# Patient Record
Sex: Female | Born: 1990 | Race: Black or African American | Hispanic: No
Health system: Midwestern US, Community
[De-identification: ages and names within clinical notes are randomized; demographics above are authoritative.]

## PROBLEM LIST (undated history)

## (undated) ENCOUNTER — Emergency Department: Discharge status: Absent without leave | Payer: Medicaid Other

## (undated) DIAGNOSIS — N189 Chronic kidney disease, unspecified: Secondary | ICD-10-CM

## (undated) HISTORY — PX: TONSILLECTOMY: SUR1361

---

## 2014-08-20 HISTORY — PX: IR NEPHROSTOMY PLACEMENT RIGHT: IMG6064

## 2016-08-12 ENCOUNTER — Emergency Department (HOSPITAL_BASED_OUTPATIENT_CLINIC_OR_DEPARTMENT_OTHER)
Admission: EM | Admit: 2016-08-12 | Discharge: 2016-08-12 | Disposition: A | Discharge status: Home / Self Care | Payer: Self-pay | Attending: Emergency Medicine | Admitting: Emergency Medicine

## 2016-08-12 ENCOUNTER — Encounter (HOSPITAL_BASED_OUTPATIENT_CLINIC_OR_DEPARTMENT_OTHER): Payer: Self-pay | Admitting: *Deleted

## 2016-08-12 DIAGNOSIS — Z3A08 8 weeks gestation of pregnancy: Secondary | ICD-10-CM | POA: Insufficient documentation

## 2016-08-12 DIAGNOSIS — O219 Vomiting of pregnancy, unspecified: Secondary | ICD-10-CM | POA: Insufficient documentation

## 2016-08-12 LAB — BASIC METABOLIC PANEL
Anion gap: 10 (ref 5–15)
BUN: 10 mg/dL (ref 6–20)
CALCIUM: 9.3 mg/dL (ref 8.9–10.3)
CO2: 22 mmol/L (ref 22–32)
CREATININE: 0.51 mg/dL (ref 0.44–1.00)
Chloride: 103 mmol/L (ref 101–111)
GFR calc Af Amer: >60 mL/min (ref >60)
GFR calc non Af Amer: >60 mL/min (ref >60)
GLUCOSE: 95 mg/dL (ref 65–99)
Potassium: 3.3 mmol/L — ABNORMAL LOW (ref 3.5–5.1)
Sodium: 135 mmol/L (ref 135–145)

## 2016-08-12 MED ORDER — ONDANSETRON HCL 4 MG PO TABS: 4.0000 mg | ORAL_TABLET | Freq: Four times a day (QID) | ORAL | 0 refills | Status: DC

## 2016-08-12 MED ORDER — SODIUM CHLORIDE 0.9 % IV BOLUS (SEPSIS)
1000.0000 mL | Freq: Once | INTRAVENOUS | Status: AC
  Administered 2016-08-12: 1000 mL via INTRAVENOUS

## 2016-08-12 MED ORDER — ONDANSETRON HCL 4 MG/2ML IJ SOLN
4.0000 mg | Freq: Once | INTRAMUSCULAR | Status: AC
  Administered 2016-08-12: 4 mg via INTRAVENOUS
  Filled 2016-08-12: qty 2

## 2016-08-12 NOTE — Discharge Instructions (Signed)
Return here as needed.  Follow-up with your OB/GYN appointment slowly increase your fluid intake

## 2016-08-12 NOTE — ED Provider Notes (Signed)
[facility]   CSN: [id number] Arrival date & time: 08/12/16  1055     History   Chief Complaint Chief Complaint  Patient presents with  . Emesis    HPI Terry Richardson is a 25 y.o. female.  HPI Patient presents to the emergency department with nausea and vomiting in pregnancy.  The patient states that she found out she was pregnant and she went to the emergency department for nausea and vomiting several weeks ago.  She is given Zofran and discharged home.  She has an OB appointment this coming week.  The patient states that she continues to have nausea and vomiting.  She states nothing seems make the condition worse.   She states that she ran out of the Zofran, which did help with the symptomsThe patient denies chest pain, shortness of breath, headache,blurred vision, neck pain, fever, cough, weakness, numbness, dizziness, anorexia, edema, abdominal pain,diarrhea, rash, back pain, dysuria, hematemesis, bloody stool, near syncope, or syncope.  Patient denies any vaginal bleeding or discharge History reviewed. No pertinent past medical history.  There are no active problems to display for this patient.   History reviewed. No pertinent surgical history.  OB History    Gravida Para Term Preterm AB Living   1             SAB TAB Ectopic Multiple Live Births                   Home Medications    Prior to Admission medications   Medication Sig Start Date End Date Taking? Authorizing Provider  Prenatal Vit-Fe Fumarate-FA (MULTIVITAMIN-PRENATAL) 27-0.8 MG TABS tablet Take 1 tablet by mouth daily at 12 noon.   Yes Historical Provider, MD    Family History No family history on file.  Social History Social History  Substance Use Topics  . Smoking status: Never Smoker  . Smokeless tobacco: Never Used  . Alcohol use No     Allergies   Patient has no known allergies.   Review of Systems Review of Systems  All other systems negative except as  documented in the HPI. All pertinent positives and negatives as reviewed in the HPI. Physical Exam Updated Vital Signs BP 133/81   Pulse 92   Temp 98.2 F (36.8 C) (Oral)   Resp 20   Ht 5\' 4"  (1.626 m)   Wt 74.1 kg   LMP 06/09/2016   SpO2 100%   BMI 28.04 kg/m   Physical Exam  Constitutional: She is oriented to person, place, and time. She appears well-developed and well-nourished. No distress.  HENT:  Head: Normocephalic and atraumatic.  Mouth/Throat: Oropharynx is clear and moist.  Eyes: Pupils are equal, round, and reactive to light.  Neck: Normal range of motion. Neck supple.  Cardiovascular: Normal rate, regular rhythm and normal heart sounds.  Exam reveals no gallop and no friction rub.   No murmur heard. Pulmonary/Chest: Effort normal and breath sounds normal. No respiratory distress. She has no wheezes.  Abdominal: Soft. Bowel sounds are normal. She exhibits no distension. There is no tenderness.  Neurological: She is alert and oriented to person, place, and time. She exhibits normal muscle tone. Coordination normal.  Skin: Skin is warm and dry. No rash noted. No erythema.  Psychiatric: She has a normal mood and affect. Her behavior is normal.  Nursing note and vitals reviewed.    ED Treatments / Results  Labs (all labs ordered are listed, but only abnormal results are  displayed) Labs Reviewed  BASIC METABOLIC PANEL - Abnormal; Notable for the following:       Result Value   Potassium 3.3 (*)    All other components within normal limits    EKG  EKG Interpretation None       Radiology No results found.  Procedures Procedures (including critical care time)  Medications Ordered in ED Medications  sodium chloride 0.9 % bolus 1,000 mL (0 mLs Intravenous Stopped 08/12/16 1202)  ondansetron (ZOFRAN) injection 4 mg (4 mg Intravenous Given 08/12/16 1131)  sodium chloride 0.9 % bolus 1,000 mL (1,000 mLs Intravenous New Bag/Given 08/12/16 1315)     Initial  Impression / Assessment and Plan / ED Course  I have reviewed the triage vital signs and the nursing notes.  Pertinent labs & imaging results that were available during my care of the patient were reviewed by me and considered in my medical decision making (see chart for details).  Clinical Course   Patient be treated with IV fluids and antiemetics.  Told to return here as needed, advised she will follow up with her GYN appointment as scheduled.  Patient has no bleeding vaginally or abdominal pain.  Therefore no further workup is initiated on her pregnancy at this time  Final Clinical Impressions(s) / ED Diagnoses   Final diagnoses:  None    New Prescriptions New Prescriptions   No medications on file     Charlestine NightChristopher Lavanna Rog, PA-C 08/12/16 1635    Arby BarretteMarcy Pfeiffer, MD 08/17/16 [phone]

## 2016-08-12 NOTE — ED Triage Notes (Signed)
[redacted] weeks pregnant vomiting.

## 2016-08-20 NOTE — L&D Delivery Note (Signed)
Delivery Note Pt admitted for IOL d/t severe pre-e, fetus w/ T21, cardiac anomalies, A-V canal defect, and hydrops. Has been on magnesium. Received BMZ x 2. Received PCN for GBS unknown, then cx returned positive. Induced w/ foley bulb, then pitocin, and arom.  NICU team called to be in attendance of delivery, neonatologist recommends against delayed cord clamping.  At 4:17 PM a viable female was delivered via Vaginal, Spontaneous Delivery (Presentation: ROA) with slight difficulty delivering remainder of baby once got to abdomen, assisted by Dr. Vergie LivingPickens to deliver Lt arm, then remainder of baby came without complication. Cord immediately clamped x 2 and cut and took baby directly to radiant warmer for care by NICU team- please refer to their notes for APGARS, weight, resuscitation notes.   Placenta status: delivered spontaneously intact, large placenta for GA.  Cord: 3vc with the following complications: short cord.   Anesthesia:  epidural Episiotomy: None Lacerations: None Suture Repair: n/a Est. Blood Loss (mL): 100  Mom to postpartum.  Baby to NICU. Placenta to path Wants to pump, IUD for contraception Continue magnesium x 24hrs, repeat cbc/cmp q 4-6hrs  Marge DuncansBooker, Lorah Kalina Randall 01/12/2017, 4:45 PM

## 2016-10-17 LAB — ANTIBODY SCREEN: Antibody Screen: NEGATIVE

## 2016-10-17 LAB — ABO/RH: ABO/Rh: O POS

## 2016-10-17 LAB — OB RESULTS CONSOLE HEPATITIS B SURFACE ANTIGEN: Hepatitis B Surface Ag: NEGATIVE

## 2016-10-17 LAB — OB RESULTS CONSOLE ABO/RH: RH TYPE: POSITIVE

## 2016-10-17 LAB — OB RESULTS CONSOLE HGB/HCT, BLOOD
HEMATOCRIT: 31 %
HEMOGLOBIN: 10.9 g/dL

## 2016-10-17 LAB — OB RESULTS CONSOLE HIV ANTIBODY (ROUTINE TESTING): HIV: NONREACTIVE

## 2016-10-17 LAB — OB RESULTS CONSOLE RPR: RPR: NONREACTIVE

## 2016-10-17 LAB — OB RESULTS CONSOLE PLATELET COUNT: Platelets: 298 10*3/uL

## 2016-10-17 LAB — OB RESULTS CONSOLE ANTIBODY SCREEN: Antibody Screen: NEGATIVE

## 2016-12-27 LAB — OB RESULTS CONSOLE VARICELLA ZOSTER ANTIBODY, IGG: Varicella: NON-IMMUNE/NOT IMMUNE

## 2016-12-27 LAB — CULTURE, OB URINE: Urine Culture, OB: NEGATIVE

## 2016-12-27 LAB — OB RESULTS CONSOLE GC/CHLAMYDIA
Chlamydia: NEGATIVE
Gonorrhea: NEGATIVE

## 2016-12-27 LAB — OB RESULTS CONSOLE RUBELLA ANTIBODY, IGM: Rubella: IMMUNE

## 2016-12-27 LAB — GLUCOSE TOLERANCE, 1 HOUR (50G) W/O FASTING: Glucose 1 Hour: 121

## 2017-01-07 ENCOUNTER — Encounter: Payer: Self-pay | Admitting: Family Medicine

## 2017-01-08 ENCOUNTER — Encounter: Payer: Self-pay | Admitting: *Deleted

## 2017-01-09 ENCOUNTER — Ambulatory Visit (INDEPENDENT_AMBULATORY_CARE_PROVIDER_SITE_OTHER): Payer: Medicaid Other | Admitting: Obstetrics and Gynecology

## 2017-01-09 ENCOUNTER — Encounter: Payer: Self-pay | Admitting: Obstetrics and Gynecology

## 2017-01-09 DIAGNOSIS — O09899 Supervision of other high risk pregnancies, unspecified trimester: Secondary | ICD-10-CM

## 2017-01-09 DIAGNOSIS — O0990 Supervision of high risk pregnancy, unspecified, unspecified trimester: Secondary | ICD-10-CM | POA: Insufficient documentation

## 2017-01-09 DIAGNOSIS — O280 Abnormal hematological finding on antenatal screening of mother: Secondary | ICD-10-CM | POA: Diagnosis not present

## 2017-01-09 DIAGNOSIS — O09893 Supervision of other high risk pregnancies, third trimester: Secondary | ICD-10-CM | POA: Diagnosis not present

## 2017-01-09 DIAGNOSIS — O358XX0 Maternal care for other (suspected) fetal abnormality and damage, not applicable or unspecified: Secondary | ICD-10-CM | POA: Diagnosis not present

## 2017-01-09 DIAGNOSIS — Z283 Underimmunization status: Secondary | ICD-10-CM | POA: Diagnosis not present

## 2017-01-09 LAB — POCT URINALYSIS DIP (DEVICE)
Glucose, UA: NEGATIVE mg/dL
Hgb urine dipstick: NEGATIVE
Nitrite: NEGATIVE
PH: 7 (ref 5.0–8.0)
Protein, ur: 30 mg/dL — AB
Specific Gravity, Urine: 1.02 (ref 1.005–1.030)
Urobilinogen, UA: 4 mg/dL — ABNORMAL HIGH (ref 0.0–1.0)

## 2017-01-09 NOTE — Progress Notes (Signed)
New ob/28 wk  packet given  OB US scheduled for June 8th @ 1030. Pt notified.  Fetal Echo scheduled with Dr. Elizebeth Brookingotton [phone] for June 28th @ 1330.  Pt notified.

## 2017-01-09 NOTE — Progress Notes (Signed)
   PRENATAL VISIT NOTE  Subjective:  Terry Richardson is a 26 y.o. G2P1001 at 4353w6d being seen today for initial  prenatal care. Patient transferred care from health department. She recently moved from [place] with records to live with her F[place] in [place].   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  Patient reports no complaints.  Contractions: Not present. Vag. Bleeding: None.  Movement: Present. Denies leaking of fluid.   The following portions of the patient's history were reviewed and updated as appropriate: allergies, current medications, past family history, past medical history, past social history, past surgical history and problem list. Problem list updated.  Objective:   Vitals:   01/09/17 1326  Weight: 184 lb 1.6 oz (83.5 kg)    Fetal Status: Fetal Heart Rate (bpm): 131 Fundal Height: 29 cm Movement: Present     General:  Alert, oriented and cooperative. Patient is in no acute distress.  Skin: Skin is warm and dry. No rash noted.   Cardiovascular: Normal heart rate noted  Respiratory: Normal respiratory effort, no problems with respiration noted  Abdomen: Soft, gravid, appropriate for gestational age. Pain/Pressure: Present     Pelvic:  Cervical exam deferred        Extremities: Normal range of motion.  Edema: None  Mental Status: Normal mood and affect. Normal behavior. Normal judgment and thought content.   Assessment and Plan:  Pregnancy: G2P1001 at 4053w6d  1. Supervision of high risk pregnancy, antepartum Reviewed ultrasound and testing results with the patient Follow- up growth and fetal echo ordered to determine severity of cardiac defect from delivery planning - US MFM OB COMP + 14 WK; Future - US Fetal  Echocardiography; Future  2. Susceptible to varicella (non-immune), currently pregnant Will offer pp  3. 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 Abnormal quad screen Patient was ambivalent for a while regarding terminating pregnancy. She desires information on possible groups with T21 children/parent to prepare for this child  Preterm labor symptoms and general obstetric precautions including but not limited to vaginal bleeding, contractions, leaking of fluid and fetal movement were reviewed in detail with the patient. Please refer to After Visit Summary for other counseling recommendations.  No Follow-up on file.   Catalina AntiguaPeggy Olyvia Gopal, MD

## 2017-01-10 ENCOUNTER — Encounter (HOSPITAL_COMMUNITY): Payer: Self-pay | Admitting: *Deleted

## 2017-01-10 ENCOUNTER — Encounter: Payer: Self-pay | Admitting: Obstetrics and Gynecology

## 2017-01-10 ENCOUNTER — Emergency Department (HOSPITAL_COMMUNITY): Payer: Medicaid Other

## 2017-01-10 ENCOUNTER — Inpatient Hospital Stay (HOSPITAL_COMMUNITY)
Admission: EM | Admit: 2017-01-10 | Discharge: 2017-01-13 | DRG: 775 | Disposition: A | Discharge status: Home / Self Care | Payer: Medicaid Other | Attending: Obstetrics & Gynecology | Admitting: Obstetrics & Gynecology

## 2017-01-10 DIAGNOSIS — Z3490 Encounter for supervision of normal pregnancy, unspecified, unspecified trimester: Secondary | ICD-10-CM

## 2017-01-10 DIAGNOSIS — O09899 Supervision of other high risk pregnancies, unspecified trimester: Secondary | ICD-10-CM

## 2017-01-10 DIAGNOSIS — O1413 Severe pre-eclampsia, third trimester: Secondary | ICD-10-CM | POA: Diagnosis not present

## 2017-01-10 DIAGNOSIS — O0990 Supervision of high risk pregnancy, unspecified, unspecified trimester: Secondary | ICD-10-CM

## 2017-01-10 DIAGNOSIS — O351XX0 Maternal care for (suspected) chromosomal abnormality in fetus, not applicable or unspecified: Secondary | ICD-10-CM | POA: Diagnosis present

## 2017-01-10 DIAGNOSIS — R7401 Elevation of levels of liver transaminase levels: Secondary | ICD-10-CM

## 2017-01-10 DIAGNOSIS — Z283 Underimmunization status: Secondary | ICD-10-CM

## 2017-01-10 DIAGNOSIS — R7989 Other specified abnormal findings of blood chemistry: Secondary | ICD-10-CM | POA: Diagnosis not present

## 2017-01-10 DIAGNOSIS — Z3A29 29 weeks gestation of pregnancy: Secondary | ICD-10-CM

## 2017-01-10 DIAGNOSIS — O285 Abnormal chromosomal and genetic finding on antenatal screening of mother: Secondary | ICD-10-CM

## 2017-01-10 DIAGNOSIS — O280 Abnormal hematological finding on antenatal screening of mother: Secondary | ICD-10-CM

## 2017-01-10 DIAGNOSIS — O1424 HELLP syndrome, complicating childbirth: Principal | ICD-10-CM | POA: Diagnosis present

## 2017-01-10 DIAGNOSIS — O9902 Anemia complicating childbirth: Secondary | ICD-10-CM | POA: Diagnosis present

## 2017-01-10 DIAGNOSIS — R740 Nonspecific elevation of levels of transaminase and lactic acid dehydrogenase [LDH]: Secondary | ICD-10-CM

## 2017-01-10 DIAGNOSIS — O26833 Pregnancy related renal disease, third trimester: Secondary | ICD-10-CM | POA: Diagnosis present

## 2017-01-10 DIAGNOSIS — N189 Chronic kidney disease, unspecified: Secondary | ICD-10-CM | POA: Diagnosis present

## 2017-01-10 DIAGNOSIS — O99824 Streptococcus B carrier state complicating childbirth: Secondary | ICD-10-CM | POA: Diagnosis present

## 2017-01-10 DIAGNOSIS — O219 Vomiting of pregnancy, unspecified: Secondary | ICD-10-CM | POA: Diagnosis present

## 2017-01-10 DIAGNOSIS — O358XX0 Maternal care for other (suspected) fetal abnormality and damage, not applicable or unspecified: Secondary | ICD-10-CM | POA: Diagnosis present

## 2017-01-10 DIAGNOSIS — O1423 HELLP syndrome (HELLP), third trimester: Secondary | ICD-10-CM | POA: Diagnosis present

## 2017-01-10 DIAGNOSIS — R112 Nausea with vomiting, unspecified: Secondary | ICD-10-CM

## 2017-01-10 DIAGNOSIS — R945 Abnormal results of liver function studies: Secondary | ICD-10-CM

## 2017-01-10 DIAGNOSIS — O359XX0 Maternal care for (suspected) fetal abnormality and damage, unspecified, not applicable or unspecified: Secondary | ICD-10-CM

## 2017-01-10 DIAGNOSIS — O288 Other abnormal findings on antenatal screening of mother: Secondary | ICD-10-CM

## 2017-01-10 DIAGNOSIS — D631 Anemia in chronic kidney disease: Secondary | ICD-10-CM | POA: Diagnosis present

## 2017-01-10 HISTORY — DX: Chronic kidney disease, unspecified: N18.9

## 2017-01-10 LAB — URINALYSIS, ROUTINE W REFLEX MICROSCOPIC
BILIRUBIN URINE: NEGATIVE
Glucose, UA: NEGATIVE mg/dL
Hgb urine dipstick: NEGATIVE
KETONES UR: 80 mg/dL — AB
Leukocytes, UA: NEGATIVE
Nitrite: NEGATIVE
PH: 6 (ref 5.0–8.0)
PROTEIN: 30 mg/dL — AB
SPECIFIC GRAVITY, URINE: 1.019 (ref 1.005–1.030)

## 2017-01-10 LAB — COMPREHENSIVE METABOLIC PANEL
ALBUMIN: 3.1 g/dL — AB (ref 3.5–5.0)
ALBUMIN: 2.5 g/dL — AB (ref 3.5–5.0)
ALK PHOS: 109 U/L (ref 38–126)
ALK PHOS: 89 U/L (ref 38–126)
ALT: 298 U/L — AB (ref 14–54)
ALT: 254 U/L — ABNORMAL HIGH (ref 14–54)
AST: 272 U/L — AB (ref 15–41)
AST: 227 U/L — AB (ref 15–41)
Anion gap: 16 — ABNORMAL HIGH (ref 5–15)
Anion gap: 13 (ref 5–15)
BILIRUBIN TOTAL: 2.1 mg/dL — AB (ref 0.3–1.2)
BUN: 7 mg/dL (ref 6–20)
BUN: 8 mg/dL (ref 6–20)
CALCIUM: 9.5 mg/dL (ref 8.9–10.3)
CHLORIDE: 104 mmol/L (ref 101–111)
CO2: 18 mmol/L — AB (ref 22–32)
CO2: 16 mmol/L — ABNORMAL LOW (ref 22–32)
CREATININE: 0.7 mg/dL (ref 0.44–1.00)
Calcium: 8.8 mg/dL — ABNORMAL LOW (ref 8.9–10.3)
Chloride: 107 mmol/L (ref 101–111)
Creatinine, Ser: 0.47 mg/dL (ref 0.44–1.00)
GFR calc Af Amer: >60 mL/min (ref >60)
GFR calc Af Amer: >60 mL/min (ref >60)
GFR calc non Af Amer: >60 mL/min (ref >60)
GLUCOSE: 95 mg/dL (ref 65–99)
GLUCOSE: 77 mg/dL (ref 65–99)
Potassium: 3 mmol/L — ABNORMAL LOW (ref 3.5–5.1)
Potassium: 3.2 mmol/L — ABNORMAL LOW (ref 3.5–5.1)
SODIUM: 138 mmol/L (ref 135–145)
Sodium: 136 mmol/L (ref 135–145)
TOTAL PROTEIN: 5.9 g/dL — AB (ref 6.5–8.1)
Total Bilirubin: 2.4 mg/dL — ABNORMAL HIGH (ref 0.3–1.2)
Total Protein: 6.7 g/dL (ref 6.5–8.1)

## 2017-01-10 LAB — CBC
HCT: 32.9 % — ABNORMAL LOW (ref 36.0–46.0)
HEMATOCRIT: 27.3 % — AB (ref 36.0–46.0)
Hemoglobin: 10.8 g/dL — ABNORMAL LOW (ref 12.0–15.0)
Hemoglobin: 9.3 g/dL — ABNORMAL LOW (ref 12.0–15.0)
MCH: 29.3 pg (ref 26.0–34.0)
MCH: 30.7 pg (ref 26.0–34.0)
MCHC: 32.8 g/dL (ref 30.0–36.0)
MCHC: 34.1 g/dL (ref 30.0–36.0)
MCV: 89.2 fL (ref 78.0–100.0)
MCV: 90.1 fL (ref 78.0–100.0)
PLATELETS: 350 10*3/uL (ref 150–400)
PLATELETS: 271 10*3/uL (ref 150–400)
RBC: 3.69 MIL/uL — ABNORMAL LOW (ref 3.87–5.11)
RBC: 3.03 MIL/uL — ABNORMAL LOW (ref 3.87–5.11)
RDW: 13.3 % (ref 11.5–15.5)
RDW: 13.8 % (ref 11.5–15.5)
WBC: 13.8 10*3/uL — ABNORMAL HIGH (ref 4.0–10.5)
WBC: 13.4 10*3/uL — AB (ref 4.0–10.5)

## 2017-01-10 LAB — RAPID URINE DRUG SCREEN, HOSP PERFORMED
Amphetamines: NOT DETECTED
BARBITURATES: NOT DETECTED
Benzodiazepines: NOT DETECTED
COCAINE: NOT DETECTED
OPIATES: NOT DETECTED
Tetrahydrocannabinol: POSITIVE — AB

## 2017-01-10 LAB — DIC (DISSEMINATED INTRAVASCULAR COAGULATION)PANEL
INR: 1.09
Prothrombin Time: 14.2 seconds (ref 11.4–15.2)
Smear Review: NONE SEEN

## 2017-01-10 LAB — ABO/RH: ABO/RH(D): O POS

## 2017-01-10 LAB — DIC (DISSEMINATED INTRAVASCULAR COAGULATION) PANEL
APTT: 24 s (ref 24–36)
D DIMER QUANT: 1.22 ug{FEU}/mL — AB (ref 0.00–0.50)
FIBRINOGEN: 565 mg/dL — AB (ref 210–475)
PLATELETS: 283 10*3/uL (ref 150–400)

## 2017-01-10 LAB — LIPASE, BLOOD: LIPASE: 13 U/L (ref 11–51)

## 2017-01-10 LAB — PROTEIN / CREATININE RATIO, URINE
Creatinine, Urine: 137.47 mg/dL
Protein Creatinine Ratio: 0.38 mg/mg{Cre} — ABNORMAL HIGH (ref 0.00–0.15)
Total Protein, Urine: 52 mg/dL

## 2017-01-10 LAB — AMYLASE: AMYLASE: 29 U/L (ref 28–100)

## 2017-01-10 MED ORDER — ONDANSETRON HCL 4 MG/2ML IJ SOLN
4.0000 mg | Freq: Once | INTRAMUSCULAR | Status: AC
  Administered 2017-01-10: 4 mg via INTRAVENOUS
  Filled 2017-01-10: qty 2

## 2017-01-10 MED ORDER — ZOLPIDEM TARTRATE 5 MG PO TABS: 5.0000 mg | ORAL_TABLET | Freq: Every evening | ORAL | Status: DC | PRN

## 2017-01-10 MED ORDER — SODIUM CHLORIDE 0.9 % IV BOLUS (SEPSIS)
2000.0000 mL | Freq: Once | INTRAVENOUS | Status: AC
  Administered 2017-01-10: 2000 mL via INTRAVENOUS

## 2017-01-10 MED ORDER — DOCUSATE SODIUM 100 MG PO CAPS: 100.0000 mg | ORAL_CAPSULE | Freq: Every day | ORAL | Status: DC

## 2017-01-10 MED ORDER — CALCIUM CARBONATE ANTACID 500 MG PO CHEW
2.0000 | CHEWABLE_TABLET | ORAL | Status: DC | PRN
  Administered 2017-01-10 – 2017-01-11 (×2): 400 mg via ORAL
  Filled 2017-01-10 (×2): qty 2

## 2017-01-10 MED ORDER — FAMOTIDINE 20 MG PO TABS
20.0000 mg | ORAL_TABLET | Freq: Two times a day (BID) | ORAL | Status: DC
  Administered 2017-01-10 – 2017-01-11 (×3): 20 mg via ORAL
  Filled 2017-01-10 (×3): qty 1

## 2017-01-10 MED ORDER — MAGNESIUM SULFATE BOLUS VIA INFUSION
4.0000 g | Freq: Once | INTRAVENOUS | Status: AC
  Administered 2017-01-10: 4 g via INTRAVENOUS
  Filled 2017-01-10: qty 500

## 2017-01-10 MED ORDER — POTASSIUM CHLORIDE CRYS ER 20 MEQ PO TBCR: 40.0000 meq | EXTENDED_RELEASE_TABLET | Freq: Once | ORAL | Status: DC

## 2017-01-10 MED ORDER — PRENATAL MULTIVITAMIN CH: 1.0000 | ORAL_TABLET | Freq: Every day | ORAL | Status: DC

## 2017-01-10 MED ORDER — BETAMETHASONE SOD PHOS & ACET 6 (3-3) MG/ML IJ SUSP
12.0000 mg | INTRAMUSCULAR | Status: AC
  Administered 2017-01-10 – 2017-01-11 (×2): 12 mg via INTRAMUSCULAR
  Filled 2017-01-10 (×2): qty 2

## 2017-01-10 MED ORDER — ACETAMINOPHEN 325 MG PO TABS
650.0000 mg | ORAL_TABLET | ORAL | Status: DC | PRN
  Administered 2017-01-10: 650 mg via ORAL
  Filled 2017-01-10: qty 2

## 2017-01-10 MED ORDER — LACTATED RINGERS IV SOLN
INTRAVENOUS | Status: DC
  Administered 2017-01-10 – 2017-01-11 (×3): via INTRAVENOUS

## 2017-01-10 MED ORDER — MAGNESIUM SULFATE 40 G IN LACTATED RINGERS - SIMPLE
2.0000 g/h | INTRAVENOUS | Status: DC
  Administered 2017-01-10 – 2017-01-12 (×3): 2 g/h via INTRAVENOUS
  Filled 2017-01-10 (×2): qty 40
  Filled 2017-01-10: qty 500

## 2017-01-10 MED ORDER — PROMETHAZINE HCL 25 MG/ML IJ SOLN
12.5000 mg | Freq: Four times a day (QID) | INTRAMUSCULAR | Status: DC | PRN
  Administered 2017-01-10: 12.5 mg via INTRAVENOUS
  Filled 2017-01-10: qty 1

## 2017-01-10 MED ORDER — ONDANSETRON HCL 4 MG/2ML IJ SOLN
4.0000 mg | Freq: Four times a day (QID) | INTRAMUSCULAR | Status: DC | PRN
  Administered 2017-01-10: 4 mg via INTRAVENOUS
  Filled 2017-01-10: qty 2

## 2017-01-10 MED ORDER — PANTOPRAZOLE SODIUM 40 MG IV SOLR
40.0000 mg | Freq: Once | INTRAVENOUS | Status: AC
  Administered 2017-01-10: 40 mg via INTRAVENOUS
  Filled 2017-01-10: qty 40

## 2017-01-10 NOTE — ED Notes (Signed)
This note also relates to the following rows which could not be included: Pulse Rate - Cannot attach notes to unvalidated device data SpO2 - Cannot attach notes to unvalidated device data  To US.

## 2017-01-10 NOTE — Progress Notes (Addendum)
[id number] Arrived to evaluate this 26 yo G2P1 @ 29.[redacted] wks GA in with complaints of epigastric pain, nausea/ vomiting, and lower abdominal pain/ cramping.  Pt denies vaginal bleeding or LOF from vagina.  She reports decreased fetal movement overnight and this am.  She has had nausea/vomiting persistently with this pregnancy.  At this time she has not been able to keep food or fluids down for more than a day.  She reports that the heartburn is so bad that she thinks that is what is causing her to vomit.  1000  FHR with minimal variability, no accels, no decels.  UC's Q 3-4 min.  IVF bolus requested.  1012  Dr. Macon LargeAnyanwu notified of pt in ED and of above.  Recommends IV hydration and continued observation.  Notified that SVE was deferred and is OK with that.  SVE to be performed only if pt begins complaining of pelvic pressure with UCs.  1106  Dr. Macon LargeAnyanwu notified of persistent UCs and decreased FHR variability and of BP readings in ED.  1117 Dr. Macon LargeAnyanwu notified of elevated LFTs.  Additional orders for US RUQ ABD and labs placed by Dr. Macon LargeAnyanwu. Orders for transfer to [facility]. EDP notified.  [facility] transfer orders will be placed. 1145 Transferred via [facility].

## 2017-01-10 NOTE — ED Triage Notes (Signed)
Per Pt, Pt is coming from home with complaints of abdominal pain and N/V that started yesterday. Pt had a regular check-up yesterday with good results, and pt reports she felt fine. Hx of vomiting with pregnancy. Pt is noted to be [redacted] weeks pregnant.

## 2017-01-10 NOTE — ED Provider Notes (Signed)
[facility] Provider Note   CSN: [id number] Arrival date & time: 01/10/17  0900     History   Chief Complaint Chief Complaint  Patient presents with  . Abdominal Pain    HPI Terry Richardson is a 26 y.o. female.  HPI complains of vomiting approximate 10 times since 6 PM yesterday. She's had similar episodes "all throughout my pregnancy" currently 28 weeks 6 days pregnant. Treated with Zofran without relief. Denies fever denies urinary symptoms complains of intermittent lower crampy abdominal pain since yesterday evening. Lasting 5 minutes at a time. "Does not feel like labor."  No past medical history on file.  Patient Active Problem List   Diagnosis Date Noted  . Supervision of high risk pregnancy, antepartum 01/09/2017  . Susceptible to varicella (non-immune), currently pregnant 01/09/2017  . 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 Abnormal quad screen 01/09/2017    No past surgical history on file.  OB History    Gravida Para Term Preterm AB Living   2 1 1     1    SAB TAB Ectopic Multiple Live Births           1       Home Medications    Prior to Admission medications   Medication Sig Start Date End Date Taking? Authorizing Provider  ondansetron (ZOFRAN) 4 MG tablet Take 1 tablet (4 mg total) by mouth every 6 (six) hours. Patient not taking: Reported on 01/09/2017 08/12/16   Charlestine Night, PA-C  Prenatal Vit-Fe Fumarate-FA (MULTIVITAMIN-PRENATAL) 27-0.8 MG TABS tablet Take 1 tablet by mouth daily at 12 noon.    [provider]    Family History No family history on file.  Social History Social History  Substance Use Topics  . Smoking status: Never Smoker  . Smokeless tobacco: Never Used  . Alcohol use No     Allergies   Patient has no known allergies.   Review of Systems Review of Systems  Constitutional: Negative.   HENT: Negative.   Respiratory: Negative.   Cardiovascular: Negative.     Gastrointestinal: Positive for abdominal pain, nausea and vomiting.  Genitourinary:       Pregnant, high risk pregnancy  Musculoskeletal: Negative.   Skin: Negative.   Neurological: Negative.   Psychiatric/Behavioral: Negative.   All other systems reviewed and are negative.    Physical Exam Updated Vital Signs BP 133/87 (BP Location: Left Arm)   Pulse (!) 126   Temp 97.4 F (36.3 C) (Oral)   Resp 18   Ht 5\' 4"  (1.626 m)   Wt 83.5 kg (184 lb)   LMP 07/12/2016 (Exact Date)   SpO2 98%   BMI 31.58 kg/m   Physical Exam  Constitutional: She appears well-developed and well-nourished. No distress.  HENT:  Head: Normocephalic and atraumatic.  Eyes: Conjunctivae are normal. Pupils are equal, round, and reactive to light.  Neck: Neck supple. No tracheal deviation present. No thyromegaly present.  Cardiovascular: Regular rhythm.   No murmur heard. Tachycardic  Pulmonary/Chest: Effort normal and breath sounds normal.  Abdominal: Soft. Bowel sounds are normal. There is no tenderness.  Gravid. Fetal heart tones 150  Musculoskeletal: Normal range of motion. She exhibits no edema or tenderness.  Neurological: She is alert. Coordination normal.  Skin: Skin is warm and dry. No rash noted.  Psychiatric: She has a normal mood and affect.  Nursing note and vitals reviewed.    ED Treatments / Results  Labs (all labs ordered are listed,  but only abnormal results are displayed) Labs Reviewed  LIPASE, BLOOD  COMPREHENSIVE METABOLIC PANEL  CBC  URINALYSIS, ROUTINE W REFLEX MICROSCOPIC    EKG  EKG Interpretation None       Radiology No results found.  Procedures Procedures (including critical care time)  Medications Ordered in ED Medications  sodium chloride 0.9 % bolus 2,000 mL (not administered)    Physical after treatment with intravenous fluids, Zofran and Protonix patient did have uterine contractions, which slowed after treatment with intravenous hydration. Oral  potassium ordered. Results for orders placed or performed during the hospital encounter of 01/10/17  Lipase, blood  Result Value Ref Range   Lipase 13 11 - 51 U/L  Comprehensive metabolic panel  Result Value Ref Range   Sodium 138 135 - 145 mmol/L   Potassium 3.0 (L) 3.5 - 5.1 mmol/L   Chloride 104 101 - 111 mmol/L   CO2 18 (L) 22 - 32 mmol/L   Glucose, Bld 95 65 - 99 mg/dL   BUN 7 6 - 20 mg/dL   Creatinine, Ser 1.61 0.44 - 1.00 mg/dL   Calcium 9.5 8.9 - 09.6 mg/dL   Total Protein 6.7 6.5 - 8.1 g/dL   Albumin 3.1 (L) 3.5 - 5.0 g/dL   AST 045 (H) 15 - 41 U/L   ALT 298 (H) 14 - 54 U/L   Alkaline Phosphatase 109 38 - 126 U/L   Total Bilirubin 2.4 (H) 0.3 - 1.2 mg/dL   GFR calc non Af Amer >60 >60 mL/min   GFR calc Af Amer >60 >60 mL/min   Anion gap 16 (H) 5 - 15  CBC  Result Value Ref Range   WBC 13.8 (H) 4.0 - 10.5 K/uL   RBC 3.69 (L) 3.87 - 5.11 MIL/uL   Hemoglobin 10.8 (L) 12.0 - 15.0 g/dL   HCT 40.9 (L) 81.1 - 91.4 %   MCV 89.2 78.0 - 100.0 fL   MCH 29.3 26.0 - 34.0 pg   MCHC 32.8 30.0 - 36.0 g/dL   RDW 78.2 95.6 - 21.3 %   Platelets 350 150 - 400 K/uL  Urinalysis, Routine w reflex microscopic  Result Value Ref Range   Color, Urine AMBER (A) YELLOW   APPearance HAZY (A) CLEAR   Specific Gravity, Urine 1.019 1.005 - 1.030   pH 6.0 5.0 - 8.0   Glucose, UA NEGATIVE NEGATIVE mg/dL   Hgb urine dipstick NEGATIVE NEGATIVE   Bilirubin Urine NEGATIVE NEGATIVE   Ketones, ur 80 (A) NEGATIVE mg/dL   Protein, ur 30 (A) NEGATIVE mg/dL   Nitrite NEGATIVE NEGATIVE   Leukocytes, UA NEGATIVE NEGATIVE   RBC / HPF 0-5 0 - 5 RBC/hpf   WBC, UA 6-30 0 - 5 WBC/hpf   Bacteria, UA RARE (A) NONE SEEN   Squamous Epithelial / LPF 6-30 (A) NONE SEEN   Mucous PRESENT    Hyaline Casts, UA PRESENT    No results found. Initial Impression / Assessment and Plan / ED Course  I have reviewed the triage vital signs and the nursing notes.  Pertinent labs & imaging results that were available  during my care of the patient were reviewed by me and considered in my medical decision making (see chart for details).   OB rapid response nurse evaluated patient and communicated with Dr.Anyanwu and arrange for transfer to [facility]  Oral potassium supplementation ordered by me Complete metabolic panel consistent with anion gap metabolic acidosis Final Clinical Impressions(s) / ED Diagnoses  Diagnosis #1  intractable vomiting #2 hypokalemia #3 urine contractions #4 elevated liver function tests Final diagnoses:  None  #5 anemia #6 anion gap metabolic acidosis  New Prescriptions New Prescriptions   No medications on file     Doug SouJacubowitz, Adelynne Joerger, MD 01/10/17 1140

## 2017-01-10 NOTE — H&P (Addendum)
ANTEPARTUM ADMISSION HISTORY AND PHYSICAL NOTE   History of Present Illness: Terry Richardson is a 26 y.o. G2P1001 at [redacted]w[redacted]d admitted for elevated LFTs as well as nausea and vomiting. Patient was noted to have some mild range blood pressures while in [place]. She reports she has not been able to keep anything down for a few days now and did vomit some blood tinged fluid last night. She report she had no problems in her last pregnancy other than a "kidney" problem that required a stent be placed. She has had no problems since then. She reports in her early pregnancy she was on zofran and had severe constipation as a result her use and she still had nausea. Patient reports the fetal movement as active. Patient reports uterine contraction  activity as irregular, tightening of the abdomen,. Patient reports  vaginal bleeding as none. Patient describes fluid per vagina as none. Patient recently moved from [place], pregnancy remarkable for fetus with increased risk of T21 on NIPS and multiple fetal anomalies.   Patient Active Problem List   Diagnosis Date Noted  . Elevated LFTs 01/10/2017  . Supervision of high risk pregnancy, antepartum 01/09/2017  . Susceptible to varicella (non-immune), currently pregnant 01/09/2017  . 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 Abnormal quad screen 01/09/2017    Past Medical History:  Diagnosis Date  . Chronic kidney disease    Had Right Nephrostomy tube placement for "tissue and protein" blockage per pt    Past Surgical History:  Procedure Laterality Date  . IR NEPHROSTOMY PLACEMENT RIGHT Right 08/2014  . TONSILLECTOMY      OB History  Gravida Para Term Preterm AB Living  2 1 1     1   SAB TAB Ectopic Multiple Live Births          1    # Outcome Date GA Lbr Len/2nd Weight Sex Delivery Anes PTL Lv  2 Current           1 Term 10/27/14 [redacted]w[redacted]d  5 lb (2.268 kg) F Vag-Spont EPI  LIV      Social History   Social  History  . Marital status: Single    Spouse name: N/A  . Number of children: N/A  . Years of education: N/A   Social History Main Topics  . Smoking status: Never Smoker  . Smokeless tobacco: Never Used  . Alcohol use No  . Drug use: Unknown  . Sexual activity: Not Asked   Other Topics Concern  . None   Social History Narrative  . None    Family History  Problem Relation Age of Onset  . Cancer Mother        cervical cancer    No Known Allergies  Prescriptions Prior to Admission  Medication Sig Dispense Refill Last Dose  . Prenatal Vit-Fe Fumarate-FA (MULTIVITAMIN-PRENATAL) 27-0.8 MG TABS tablet Take 1 tablet by mouth daily at 12 noon.   Past Week at Unknown time  . ondansetron (ZOFRAN) 4 MG tablet Take 1 tablet (4 mg total) by mouth every 6 (six) hours. (Patient not taking: Reported on 01/09/2017) 12 tablet 0 Not Taking    Review of Systems  Constitutional: Positive for chills. Negative for fever.  HENT: Negative for congestion and sore throat.   Eyes: Negative for blurred vision, double vision and photophobia.  Respiratory: Negative for cough, shortness of breath and wheezing.   Cardiovascular: Positive for leg swelling. Negative for chest pain and palpitations.  Gastrointestinal:  Positive for abdominal pain, heartburn, nausea and vomiting. Negative for constipation and diarrhea.  Genitourinary: Negative for dysuria, frequency, hematuria and urgency.  Musculoskeletal: Negative for back pain, myalgias and neck pain.  Skin: Negative for itching and rash.  Neurological: Negative for dizziness, focal weakness, weakness and headaches.  Endo/Heme/Allergies: Does not bruise/bleed easily.     Vitals:  BP 129/77   Pulse (!) 114   Temp 97.4 F (36.3 C) (Oral)   Resp 16   Ht 5\' 4"  (1.626 m)   Wt 184 lb (83.5 kg)   LMP 07/12/2016 (Exact Date)   SpO2 99%   BMI 31.58 kg/m  Physical Examination: CONSTITUTIONAL: Well-developed, well-nourished female in no acute distress.   HENT:  Normocephalic, atraumatic, External right and left ear normal. Oropharynx is clear and moist EYES: Conjunctivae and EOM are normal. Pupils are equal, round, and reactive to light. No scleral icterus.  NECK: Normal range of motion, supple, no masses SKIN: Skin is warm and dry. No rash noted. Not diaphoretic. No erythema. No pallor. NEUROLGIC: Alert and oriented to person, place, and time. Mildly hyper-flexic on patellar reflexes. No other complaints. PSYCHIATRIC: Normal mood and affect. Normal behavior. Normal judgment and thought content. CARDIOVASCULAR: Normal heart rate noted, regular rhythm RESPIRATORY: Effort and breath sounds normal, no problems with respiration noted ABDOMEN: Soft, nontender, nondistended, gravid. MUSCULOSKELETAL: Normal range of motion. No edema and no tenderness. 2+ distal pulses.  Cervix: not checked Membranes:intact Fetal Monitoring: 135,mod var, no accels, occasional decelerations Tocometer: contractions every 5 - 10 minutes  Labs:  Results for orders placed or performed during the hospital encounter of 01/10/17 (from the past 24 hour(s))  Lipase, blood   Collection Time: 01/10/17  9:18 AM  Result Value Ref Range   Lipase 13 11 - 51 U/L  Comprehensive metabolic panel   Collection Time: 01/10/17  9:18 AM  Result Value Ref Range   Sodium 138 135 - 145 mmol/L   Potassium 3.0 (L) 3.5 - 5.1 mmol/L   Chloride 104 101 - 111 mmol/L   CO2 18 (L) 22 - 32 mmol/L   Glucose, Bld 95 65 - 99 mg/dL   BUN 7 6 - 20 mg/dL   Creatinine, Ser 1.61 0.44 - 1.00 mg/dL   Calcium 9.5 8.9 - 09.6 mg/dL   Total Protein 6.7 6.5 - 8.1 g/dL   Albumin 3.1 (L) 3.5 - 5.0 g/dL   AST 045 (H) 15 - 41 U/L   ALT 298 (H) 14 - 54 U/L   Alkaline Phosphatase 109 38 - 126 U/L   Total Bilirubin 2.4 (H) 0.3 - 1.2 mg/dL   GFR calc non Af Amer >60 >60 mL/min   GFR calc Af Amer >60 >60 mL/min   Anion gap 16 (H) 5 - 15  CBC   Collection Time: 01/10/17  9:18 AM  Result Value Ref Range    WBC 13.8 (H) 4.0 - 10.5 K/uL   RBC 3.69 (L) 3.87 - 5.11 MIL/uL   Hemoglobin 10.8 (L) 12.0 - 15.0 g/dL   HCT 40.9 (L) 81.1 - 91.4 %   MCV 89.2 78.0 - 100.0 fL   MCH 29.3 26.0 - 34.0 pg   MCHC 32.8 30.0 - 36.0 g/dL   RDW 78.2 95.6 - 21.3 %   Platelets 350 150 - 400 K/uL  Urinalysis, Routine w reflex microscopic   Collection Time: 01/10/17 11:00 AM  Result Value Ref Range   Color, Urine AMBER (A) YELLOW   APPearance HAZY (A) CLEAR  Specific Gravity, Urine 1.019 1.005 - 1.030   pH 6.0 5.0 - 8.0   Glucose, UA NEGATIVE NEGATIVE mg/dL   Hgb urine dipstick NEGATIVE NEGATIVE   Bilirubin Urine NEGATIVE NEGATIVE   Ketones, ur 80 (A) NEGATIVE mg/dL   Protein, ur 30 (A) NEGATIVE mg/dL   Nitrite NEGATIVE NEGATIVE   Leukocytes, UA NEGATIVE NEGATIVE   RBC / HPF 0-5 0 - 5 RBC/hpf   WBC, UA 6-30 0 - 5 WBC/hpf   Bacteria, UA RARE (A) NONE SEEN   Squamous Epithelial / LPF 6-30 (A) NONE SEEN   Mucous PRESENT    Hyaline Casts, UA PRESENT   Rapid urine drug screen (hospital performed)   Collection Time: 01/10/17 11:00 AM  Result Value Ref Range   Opiates NONE DETECTED NONE DETECTED   Cocaine NONE DETECTED NONE DETECTED   Benzodiazepines NONE DETECTED NONE DETECTED   Amphetamines NONE DETECTED NONE DETECTED   Tetrahydrocannabinol POSITIVE (A) NONE DETECTED   Barbiturates NONE DETECTED NONE DETECTED  Protein / creatinine ratio, urine   Collection Time: 01/10/17 11:00 AM  Result Value Ref Range   Creatinine, Urine 137.47 mg/dL   Total Protein, Urine 52 mg/dL   Protein Creatinine Ratio 0.38 (H) 0.00 - 0.15 mg/mg[Cre]  DIC (disseminated intravasc coag) panel   Collection Time: 01/10/17 11:35 AM  Result Value Ref Range   Prothrombin Time 14.2 11.4 - 15.2 seconds   INR 1.09    aPTT 24 24 - 36 seconds   Fibrinogen 565 (H) 210 - 475 mg/dL   D-Dimer, Quant 9.601.22 (H) 0.00 - 0.50 ug/mL-FEU   Platelets 283 150 - 400 K/uL   Smear Review PENDING   Amylase   Collection Time: 01/10/17 11:35 AM   Result Value Ref Range   Amylase 29 28 - 100 U/L  Results for orders placed or performed in visit on 01/09/17 (from the past 24 hour(s))  POCT urinalysis dip (device)   Collection Time: 01/09/17  2:17 PM  Result Value Ref Range   Glucose, UA NEGATIVE NEGATIVE mg/dL   Bilirubin Urine MODERATE (A) NEGATIVE   Ketones, ur >=160 (A) NEGATIVE mg/dL   Specific Gravity, Urine 1.020 1.005 - 1.030   Hgb urine dipstick NEGATIVE NEGATIVE   pH 7.0 5.0 - 8.0   Protein, ur 30 (A) NEGATIVE mg/dL   Urobilinogen, UA 4.0 (H) 0.0 - 1.0 mg/dL   Nitrite NEGATIVE NEGATIVE   Leukocytes, UA TRACE (A) NEGATIVE    Imaging Studies: Kore[place] Abdomen Limited Ruq  Result Date: 01/10/2017 CLINICAL DATA:  Chronic kidney disease.  Right upper quadrant pain. EXAM: US ABDOMEN LIMITED - RIGHT UPPER QUADRANT COMPARISON:  None. FINDINGS: Gallbladder: No gallstones or wall thickening visualized. Small amount of gallbladder sludge. No sonographic Murphy sign noted by sonographer. Common bile duct: Diameter: 3 mm Liver: No focal lesion identified. Within normal limits in parenchymal echogenicity. Other:  Severe right hydronephrosis partially visualize. IMPRESSION: 1. No cholelithiasis or sonographic evidence of acute cholecystitis. 2. Severe right hydronephrosis of uncertain etiology. Electronically Signed   By: Elige KoHetal  Patel   On: 01/10/2017 12:04     Assessment and Plan: Patient Active Problem List   Diagnosis Date Noted  . Elevated LFTs 01/10/2017  . Supervision of high risk pregnancy, antepartum 01/09/2017  . Susceptible to varicella (non-immune), currently pregnant 01/09/2017  . 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 Abnormal quad screen 01/09/2017   Admit to Antenatal Hyperemesis: May be cause of elevetaed LFT's but higher that one would  expect -zantac -zofran -phergan -LR@125ml /hr Elevated LFTs -unclear etiology, hyperemesis vs acute fatty liver vs PRE-E/HELLP -RUQ [place] normal  aside from hydronephrosis -hepatitis, bile acids, ANA pending -amylase/lipase normal, DIC panel normal for pregnancy -recheck LFTs and CBC at 5PM Elevated BP/Preeclampsia: -PR/CR ratio elevated 0.38. 24 hours urine pending. -mild range BP in ED normal now -will give betamethasone regimen. Multiple fetal anomalies/abnormal NIPS with increased risk of T21 -will follow up with MFM, will be presented to [facility] -MFM ultrasound ordered Continuous monitoring and tocometry for now  Routine antenatal care  Ernestina Penna, MD OB Fellow Faculty Practice, [facility]   Attestation of Attending Supervision of Obstetric Fellow: Evaluation and management procedures were performed by the Obstetric Fellow under my supervision and collaboration.  I have reviewed the Obstetric Fellow's note and chart, and I agree with the management and plan.  Will follow up labs and continue to monitor closely. BP stable now, but will initiate magnesium sulfate for eclampsia prophylaxis for now given presumptive diagnosis of severe preeclampsia (awaiting other evaluation for elevated LFTs).  Discussed patient's clinical situation with Dr. Clarisa Fling and Dr. Claudean Severance (MFM) who agree with this plan.  Plan discussed with patient, who also agreed with plan.    Jaynie Collins, MD, FACOG Attending Obstetrician & Gynecologist Faculty Practice, [facility]

## 2017-01-10 NOTE — ED Notes (Signed)
Spoke with US, they are en route to get patient.

## 2017-01-10 NOTE — Progress Notes (Signed)
Initial visit with [place] upon referral from RN who reports pt's baby has some anomolies.  Introduced spiritual care services and offered support.  Pt reports she has a daughter at home, but feels okay being away from her.  She doesn't know her baby's sex yet, but is hopeful she'll find out while she's here in the hospital.  She reports feeling exhausted.  Normalized feelings.  Pt is aware of how to get in touch with [place] if she desires additional support.  Please page as further needs arise.  Maryanna ShapeAmanda M. Carley Hammedavee Lomax, M.Div. [facility] Chaplain Pager [phone] Office [phone]     01/10/17 1515  Clinical Encounter Type  Visited With Patient  Visit Type Initial  Stress Factors  Patient Stress Factors Loss of control

## 2017-01-11 ENCOUNTER — Ambulatory Visit (HOSPITAL_COMMUNITY)
Admit: 2017-01-11 | Discharge: 2017-01-11 | Disposition: A | Discharge status: Home / Self Care | Payer: Medicaid Other | Attending: Obstetrics & Gynecology | Admitting: Obstetrics & Gynecology

## 2017-01-11 ENCOUNTER — Observation Stay (HOSPITAL_COMMUNITY): Payer: Medicaid Other

## 2017-01-11 DIAGNOSIS — Z3A29 29 weeks gestation of pregnancy: Secondary | ICD-10-CM | POA: Diagnosis not present

## 2017-01-11 DIAGNOSIS — O99824 Streptococcus B carrier state complicating childbirth: Secondary | ICD-10-CM | POA: Diagnosis present

## 2017-01-11 DIAGNOSIS — O358XX0 Maternal care for other (suspected) fetal abnormality and damage, not applicable or unspecified: Secondary | ICD-10-CM | POA: Diagnosis present

## 2017-01-11 DIAGNOSIS — O1413 Severe pre-eclampsia, third trimester: Secondary | ICD-10-CM | POA: Diagnosis not present

## 2017-01-11 DIAGNOSIS — O351XX0 Maternal care for (suspected) chromosomal abnormality in fetus, not applicable or unspecified: Secondary | ICD-10-CM | POA: Diagnosis present

## 2017-01-11 DIAGNOSIS — O1424 HELLP syndrome, complicating childbirth: Secondary | ICD-10-CM | POA: Diagnosis present

## 2017-01-11 DIAGNOSIS — R7989 Other specified abnormal findings of blood chemistry: Secondary | ICD-10-CM | POA: Diagnosis not present

## 2017-01-11 DIAGNOSIS — D631 Anemia in chronic kidney disease: Secondary | ICD-10-CM | POA: Diagnosis present

## 2017-01-11 DIAGNOSIS — O26833 Pregnancy related renal disease, third trimester: Secondary | ICD-10-CM | POA: Diagnosis present

## 2017-01-11 DIAGNOSIS — R740 Nonspecific elevation of levels of transaminase and lactic acid dehydrogenase [LDH]: Secondary | ICD-10-CM | POA: Diagnosis present

## 2017-01-11 DIAGNOSIS — O9902 Anemia complicating childbirth: Secondary | ICD-10-CM | POA: Diagnosis present

## 2017-01-11 DIAGNOSIS — N189 Chronic kidney disease, unspecified: Secondary | ICD-10-CM | POA: Diagnosis present

## 2017-01-11 DIAGNOSIS — O219 Vomiting of pregnancy, unspecified: Secondary | ICD-10-CM | POA: Diagnosis present

## 2017-01-11 LAB — COMPREHENSIVE METABOLIC PANEL
ALBUMIN: 2.5 g/dL — AB (ref 3.5–5.0)
ALK PHOS: 88 U/L (ref 38–126)
ALK PHOS: 96 U/L (ref 38–126)
ALT: 271 U/L — ABNORMAL HIGH (ref 14–54)
ALT: 327 U/L — AB (ref 14–54)
AST: 228 U/L — ABNORMAL HIGH (ref 15–41)
AST: 285 U/L — ABNORMAL HIGH (ref 15–41)
Albumin: 2.7 g/dL — ABNORMAL LOW (ref 3.5–5.0)
Anion gap: 11 (ref 5–15)
Anion gap: 11 (ref 5–15)
BILIRUBIN TOTAL: 1.4 mg/dL — AB (ref 0.3–1.2)
BILIRUBIN TOTAL: 1.2 mg/dL (ref 0.3–1.2)
BUN: <5 mg/dL — ABNORMAL LOW (ref 6–20)
BUN: <5 mg/dL — ABNORMAL LOW (ref 6–20)
CALCIUM: 7.8 mg/dL — AB (ref 8.9–10.3)
CO2: 18 mmol/L — AB (ref 22–32)
CO2: 20 mmol/L — AB (ref 22–32)
CREATININE: 0.5 mg/dL (ref 0.44–1.00)
Calcium: 7.5 mg/dL — ABNORMAL LOW (ref 8.9–10.3)
Chloride: 107 mmol/L (ref 101–111)
Chloride: 106 mmol/L (ref 101–111)
Creatinine, Ser: 0.51 mg/dL (ref 0.44–1.00)
GFR calc Af Amer: >60 mL/min (ref >60)
GFR calc non Af Amer: >60 mL/min (ref >60)
GLUCOSE: 146 mg/dL — AB (ref 65–99)
Glucose, Bld: 132 mg/dL — ABNORMAL HIGH (ref 65–99)
Potassium: 3 mmol/L — ABNORMAL LOW (ref 3.5–5.1)
Potassium: 2.9 mmol/L — ABNORMAL LOW (ref 3.5–5.1)
SODIUM: 136 mmol/L (ref 135–145)
SODIUM: 137 mmol/L (ref 135–145)
TOTAL PROTEIN: 5.5 g/dL — AB (ref 6.5–8.1)
Total Protein: 6.4 g/dL — ABNORMAL LOW (ref 6.5–8.1)

## 2017-01-11 LAB — HEPATITIS PANEL, ACUTE
HCV Ab: 0.1 s/co ratio (ref 0.0–0.9)
HEP A IGM: NEGATIVE
HEP B C IGM: NEGATIVE
HEP B S AG: NEGATIVE

## 2017-01-11 LAB — PROTEIN ELECTROPHORESIS, SERUM
A/G Ratio: 0.9 (ref 0.7–1.7)
ALPHA-2-GLOBULIN: 0.7 g/dL (ref 0.4–1.0)
Albumin ELP: 2.6 g/dL — ABNORMAL LOW (ref 2.9–4.4)
Alpha-1-Globulin: 0.4 g/dL (ref 0.0–0.4)
BETA GLOBULIN: 1.3 g/dL (ref 0.7–1.3)
Gamma Globulin: 0.6 g/dL (ref 0.4–1.8)
Globulin, Total: 3 g/dL (ref 2.2–3.9)
Total Protein ELP: 5.6 g/dL — ABNORMAL LOW (ref 6.0–8.5)

## 2017-01-11 LAB — CBC
HCT: 25.9 % — ABNORMAL LOW (ref 36.0–46.0)
Hemoglobin: 9 g/dL — ABNORMAL LOW (ref 12.0–15.0)
MCH: 30.9 pg (ref 26.0–34.0)
MCHC: 34.7 g/dL (ref 30.0–36.0)
MCV: 89 fL (ref 78.0–100.0)
PLATELETS: 265 10*3/uL (ref 150–400)
RBC: 2.91 MIL/uL — ABNORMAL LOW (ref 3.87–5.11)
RDW: 13.6 % (ref 11.5–15.5)
WBC: 12.4 10*3/uL — ABNORMAL HIGH (ref 4.0–10.5)

## 2017-01-11 LAB — BILE ACIDS, TOTAL: BILE ACIDS TOTAL: 5.6 umol/L (ref 4.7–24.5)

## 2017-01-11 LAB — PROTEIN, URINE, 24 HOUR
COLLECTION INTERVAL-UPROT: 24 h
PROTEIN, URINE: 13 mg/dL
Protein, 24H Urine: 514 mg/d — ABNORMAL HIGH (ref 50–100)
Urine Total Volume-UPROT: 3950 mL

## 2017-01-11 LAB — ANTINUCLEAR ANTIBODIES, IFA: ANA Ab, IFA: NEGATIVE

## 2017-01-11 MED ORDER — SOD CITRATE-CITRIC ACID 500-334 MG/5ML PO SOLN
30.0000 mL | ORAL | Status: DC | PRN
  Administered 2017-01-12: 30 mL via ORAL
  Filled 2017-01-11: qty 15

## 2017-01-11 MED ORDER — OXYTOCIN 40 UNITS IN LACTATED RINGERS INFUSION - SIMPLE MED
2.5000 [IU]/h | INTRAVENOUS | Status: DC
  Filled 2017-01-11: qty 1000

## 2017-01-11 MED ORDER — LIDOCAINE HCL (PF) 1 % IJ SOLN
30.0000 mL | INTRAMUSCULAR | Status: DC | PRN
  Filled 2017-01-11: qty 30

## 2017-01-11 MED ORDER — ONDANSETRON HCL 4 MG/2ML IJ SOLN
4.0000 mg | Freq: Four times a day (QID) | INTRAMUSCULAR | Status: DC | PRN
  Administered 2017-01-11 – 2017-01-12 (×3): 4 mg via INTRAVENOUS
  Filled 2017-01-11 (×3): qty 2

## 2017-01-11 MED ORDER — ACETAMINOPHEN 325 MG PO TABS
650.0000 mg | ORAL_TABLET | ORAL | Status: DC | PRN
Start: 2017-01-11 — End: 2017-01-12

## 2017-01-11 MED ORDER — OXYTOCIN 40 UNITS IN LACTATED RINGERS INFUSION - SIMPLE MED
1.0000 m[IU]/min | INTRAVENOUS | Status: DC
  Administered 2017-01-11: 2 m[IU]/min via INTRAVENOUS
  Filled 2017-01-11: qty 1000

## 2017-01-11 MED ORDER — MISOPROSTOL 25 MCG QUARTER TABLET
25.0000 ug | ORAL_TABLET | ORAL | Status: DC | PRN
  Filled 2017-01-11 (×2): qty 1

## 2017-01-11 MED ORDER — LACTATED RINGERS IV SOLN
INTRAVENOUS | Status: AC
  Administered 2017-01-12: 20:00:00 via INTRAVENOUS
  Administered 2017-01-12: 100 mL/h via INTRAVENOUS
  Administered 2017-01-13: 08:00:00 via INTRAVENOUS

## 2017-01-11 MED ORDER — POTASSIUM CHLORIDE CRYS ER 20 MEQ PO TBCR
40.0000 meq | EXTENDED_RELEASE_TABLET | Freq: Two times a day (BID) | ORAL | Status: DC
  Administered 2017-01-11 (×2): 40 meq via ORAL
  Filled 2017-01-11 (×5): qty 2

## 2017-01-11 MED ORDER — OXYCODONE-ACETAMINOPHEN 5-325 MG PO TABS: 2.0000 | ORAL_TABLET | ORAL | Status: DC | PRN

## 2017-01-11 MED ORDER — OXYCODONE-ACETAMINOPHEN 5-325 MG PO TABS
1.0000 | ORAL_TABLET | ORAL | Status: DC | PRN
  Administered 2017-01-12: 1 via ORAL
  Filled 2017-01-11: qty 1

## 2017-01-11 MED ORDER — TERBUTALINE SULFATE 1 MG/ML IJ SOLN: 0.2500 mg | Freq: Once | INTRAMUSCULAR | Status: DC | PRN

## 2017-01-11 MED ORDER — OXYTOCIN BOLUS FROM INFUSION
500.0000 mL | Freq: Once | INTRAVENOUS | Status: AC
  Administered 2017-01-12: 500 mL via INTRAVENOUS

## 2017-01-11 MED ORDER — POTASSIUM CHLORIDE 20 MEQ PO PACK
40.0000 meq | PACK | Freq: Two times a day (BID) | ORAL | Status: DC
  Filled 2017-01-11: qty 2

## 2017-01-11 MED ORDER — LACTATED RINGERS IV SOLN: 500.0000 mL | INTRAVENOUS | Status: DC | PRN

## 2017-01-11 MED ORDER — FENTANYL CITRATE (PF) 100 MCG/2ML IJ SOLN
100.0000 ug | INTRAMUSCULAR | Status: DC | PRN
  Administered 2017-01-11 (×2): 100 ug via INTRAVENOUS
  Filled 2017-01-11 (×2): qty 2

## 2017-01-11 NOTE — Progress Notes (Signed)
Patient ID: Terry Richardson, female   DOB: 12-Nov-1990, [age] y.o.   MRN: [id number]  After review of the US with MFM and consultation with MFM and Neonatology, I spoke to pt regarding the results of the US and the condition of her baby. We discussed the dx of preeclampsia and the prognosis for the pt. We discussed the prognosis for the baby and the delivery options. After I explained the entire situation the pt reported that she did 'not want the baby to suffer' and she stated that if the baby does not tolerate labor she would like to proceed with a cesarean section even if that means that she may have to have a classical c-section. I explained to her that a classical c-section means that EVERY subsequent pregnancy will require a repeat c-section at an early gestation and she expressed understanding.  She would like to attempt a SVD with a c-section only if the baby does not tolerate labor. All questions were answered.  The chaplain and the pts nurse were present for the entire discussion.  Larence Thone L. Harraway-Smith, M.D., Evern CoreFACOG

## 2017-01-11 NOTE — Progress Notes (Addendum)
Labor Progress Note Terry Richardson is a 26 y.o. G2P1001 at 165w1d presented for Severe Pre-E by LFTs > 2xULN and found to have fetal hydrops fetalis.   S: Feeling at peace with diagnoses today. Feeling supported by family in the room. Feeling painful contractions after CST.   Denies headache, vision changes, scotoma, epigastric/RUQ abdominal pain, dyspnea, edema.   O:  BP 126/88   Pulse 95   Temp 98.2 F (36.8 C) (Oral)   Resp 16   Ht 5\' 4"  (1.626 m)   Wt 83.5 kg (184 lb)   LMP 07/12/2016 (Exact Date)   SpO2 99%   BMI 31.58 kg/m  EFM: 115/min variability/10x10 accels   CVE: Dilation: 1.5 Presentation: Vertex Exam by:: Joleah Kosak, md   A&P: 26 y.o. 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  #IOL: Negative CST. Will proceed with IOL with FB followed by pitocin. FB placed without complication. Classical and LTCS have been discussed with the patient should fetal intolerance or labor complications arise.  #Pre-E with SF by LFTs > 2xULN. On Mg gtt. Asymptomatic currently. Pressures normal to mild range. No signs of Mg toxicity. Adequate UOP.  - Continue Mg gtt for 24 hours PP - Trend CBC and CMP at least daily  #Pain: Eventual epidural.  #FWB: Hydrops fetalis, Trisomy 21, and VSD. S/p NICU and MFM consults. S/p BMZ x 2 (05/24-05/25). Recommend proceeding with IOL for maternal indications. Guarded fetal prognosis at this time. NICU will be present for delivery and necessary resuscitative measures.  #GBS: pending, will await culture results until FB out. If starting pit will start PCN ppx at least until culture results.    Al CorpusMatthew R Uriah Philipson, MD 7:24 PM

## 2017-01-11 NOTE — Progress Notes (Signed)
Follow up visit with Morrison Oldisha and also her daughter Michelle PiperLayla.  Taffy appeared in positive spirits, but shared that she just learned that her daughter cannot visit was somewhat concerned about her partner's ability to get back as she'd just been notified that delivery was advisable.  Morrison Oldisha shared that she was okay with the idea of delivering now just preoccupied about Leyla.  Our visit was brief as neo and financial counselor were also waiting to see her.  I left her with coloring sheets and pencils for her daughter   Once her partner arrives he will take their daughter home.  I asked if he would be staying to support her with delivery and she said no, but that she was fine. She stated it would be helpful to check in later today.  Notified afternoon chaplain and she is aware of how to contact spiritual care services.  I also left her my card.  Pt's RN stated baby was dx with hydrops today.  Pt's mother and sister are on a flight now and a friend is driving up.  Please page as further needs arise.  Maryanna ShapeAmanda M. Carley Hammedavee Lomax, M.Div. [facility] Chaplain Pager [phone] Office [phone]

## 2017-01-11 NOTE — Consult Note (Signed)
Maternal Fetal Medicine Consultation  Requesting Provider(s): Dr. Erin FullingHarraway-Smith  Reason for consultation: Possible atypical preeclampsia, fetus with T21 by NIPS and suspected VSD  HPI: Terry Richardson is a 26 yo G2P1001, EDD 03/28/2017 who is currently at 29w 1d seen for consultation due to elevated LFTS and findings worrisome for preeclampsia.  [name] about a month ago.  NIPT from [place] returned positive for Trisomy 21.  The patient reports that there was some suspicion of a VSD on her ultrasound there (unable to review ultrasound outside ultrasound report).  [name] presented to the ED yesterday due to severe nausea and vomiting with some blood in her emesis.  She reports problems with hyperemesis throughout her pregnancy.  Her admission LFTs were elevated into the 200's.  She underwent a RUQ ultrasound that was normal.  Hepatitis panel is pending.  A urine P/C ratio of 0.38 was noted.  A 24-hr urine protein collection is currently ongoing.  Since admission, the patient has had some elevated blood pressures (140's/90's)- but has mostly been normotensive. She denies HA, visual changes, RUQ pain or other s/sx of preeclampsia.  OB History: OB History    Gravida Para Term Preterm AB Living   2 1 1     1    SAB TAB Ectopic Multiple Live Births           1      PMH:  Past Medical History:  Diagnosis Date  . Chronic kidney disease    Had Right Nephrostomy tube placement for "tissue and protein" blockage per pt    PSH:  Past Surgical History:  Procedure Laterality Date  . IR NEPHROSTOMY PLACEMENT RIGHT Right 08/2014  . TONSILLECTOMY     Meds:  Current Facility-Administered Medications on File Prior to Encounter  Medication Dose Route Frequency Provider Last Rate Last Dose  . acetaminophen (TYLENOL) tablet 650 mg  650 mg Oral Q4H PRN Lorne SkeensSchenk, Nicholas Michael, MD   650 mg at 01/10/17 1723  . betamethasone acetate-betamethasone sodium  phosphate (CELESTONE) injection 12 mg  12 mg Intramuscular Q24H Lorne SkeensSchenk, Nicholas Michael, MD   12 mg at 01/10/17 1421  . calcium carbonate (TUMS - dosed in mg elemental calcium) chewable tablet 400 mg of elemental calcium  2 tablet Oral Q4H PRN Lorne SkeensSchenk, Nicholas Michael, MD   400 mg of elemental calcium at 01/11/17 0616  . docusate sodium (COLACE) capsule 100 mg  100 mg Oral Daily Lorne SkeensSchenk, Nicholas Michael, MD      . famotidine (PEPCID) tablet 20 mg  20 mg Oral BID Lorne SkeensSchenk, Nicholas Michael, MD   20 mg at 01/10/17 2143  . lactated ringers infusion   Intravenous Continuous Lorne SkeensSchenk, Nicholas Michael, MD 125 mL/hr at 01/11/17 [phone]    . magnesium sulfate 40 grams in LR 500 mL OB infusion  2 g/hr Intravenous Titrated Willodean RosenthalHarraway-Smith, Carolyn, MD 25 mL/hr at 01/10/17 1731 2 g/hr at 01/10/17 1731  . ondansetron (ZOFRAN) injection 4 mg  4 mg Intravenous Q6H PRN Lorne SkeensSchenk, Nicholas Michael, MD   4 mg at 01/10/17 2143  . potassium chloride (KLOR-CON) packet 40 mEq  40 mEq Oral BID Anyanwu, Ugonna A, MD      . prenatal multivitamin tablet 1 tablet  1 tablet Oral Q1200 Lorne SkeensSchenk, Nicholas Michael, MD      . promethazine [facility](PHENERGAN) injection 12.5 mg  12.5 mg Intravenous Q6H PRN Lorne SkeensSchenk, Nicholas Michael, MD   12.5 mg at 01/10/17 1305  . zolpidem (AMBIEN) tablet 5  mg  5 mg Oral QHS PRN Lorne Skeens, MD       Current Outpatient Prescriptions on File Prior to Encounter  Medication Sig Dispense Refill  . acetaminophen (TYLENOL) 500 MG tablet Take 1,000 mg by mouth every 6 (six) hours as needed for mild pain or headache.    . Prenatal Vit-Fe Fumarate-FA (MULTIVITAMIN-PRENATAL) 27-0.8 MG TABS tablet Take 1 tablet by mouth daily at 12 noon.     Allergies: No Known Allergies   FH:  Family History  Problem Relation Age of Onset  . Cancer Mother        cervical cancer   Soc:  Social History   Social History  . Marital status: Single    Spouse name: N/A  . Number of children: N/A  . Years of education: N/A    Occupational History  . Not on file.   Social History Main Topics  . Smoking status: Never Smoker  . Smokeless tobacco: Never Used  . Alcohol use No  . Drug use: Unknown  . Sexual activity: Not on file   Other Topics Concern  . Not on file   Social History Narrative  . No narrative on file    PE:  VS: 133/72, 106 BPs: 140/81, 133/83, 131/85, 144/85, 141/58, 138/69  GEN: well-appearing female ABD: gravid, NT  Ultrasound: Single IUP at 29w 1d Fetus with Trisomy 21 by NIPT Fetal Hydrops noted - polyhydramnios, diffuse skin edema, pleural effusion and ascites noted BPP 6/8 (-2 for absent fetal breathing) The estimated fetal weight is > 90th %tile, but likely overestimated due to diffuse skin edema Anterior placenta  Labs: CBC    Component Value Date/Time   WBC 12.4 (H) 01/11/2017 0605   RBC 2.91 (L) 01/11/2017 0605   HGB 9.0 (L) 01/11/2017 0605   HGB 10.9 10/17/2016   HCT 25.9 (L) 01/11/2017 0605   HCT 31 10/17/2016   PLT 265 01/11/2017 0605   PLT 298 10/17/2016   MCV 89.0 01/11/2017 0605   MCH 30.9 01/11/2017 0605   MCHC 34.7 01/11/2017 0605   RDW 13.6 01/11/2017 0605   CMP     Component Value Date/Time   NA 136 01/11/2017 0605   K 3.0 (L) 01/11/2017 0605   CL 107 01/11/2017 0605   CO2 18 (L) 01/11/2017 0605   GLUCOSE 146 (H) 01/11/2017 0605   BUN <5 (L) 01/11/2017 0605   CREATININE 0.50 01/11/2017 0605   CALCIUM 7.8 (L) 01/11/2017 0605   PROT 5.5 (L) 01/11/2017 0605   ALBUMIN 2.5 (L) 01/11/2017 0605   AST 228 (H) 01/11/2017 0605   ALT 271 (H) 01/11/2017 0605   ALKPHOS 88 01/11/2017 0605   BILITOT 1.4 (H) 01/11/2017 0605   GFRNONAA >60 01/11/2017 0605   GFRAA >60 01/11/2017 1610   Urine Protein/ Creatinine: 0.38  RUQ U/S - no gallstones or wall thickening visualized.  A/P: 1) Single IUP at 29w 1d  2) Fetus with T21 by NIPT, suspected VSD on outside ultrasound - now with fetal hydrops (? Cardiac etiology).  Limited views of the anatomy obtained  due to diffuse skin edema.  Had brief discussion regarding guarded prognosis for the fetus - risk of IUFD with continued expectant management or neonatal demise with delivery.  Would recommend NICU consultation for further counseling.  We briefly discussed likelihood of requiring cesarean delivery and likely possibility that a classical C-section would be required and potential risks for future pregnancies.  The patient has completed her first dose of betamethasone  and is scheduled for her second injection this afternoon.  3) Elevated LFTS - had normal RUQ ultrasound.  Hepatitis panel is currently pending.  The patient has had some isolated blood pressures in the 140/90 range, but for the most part has been normotensive.  A 24-hr urine protein is currently pending, but the urine P/C ratio was mildly elevated.  With no other explanation for the elevated LFTs, my suspicion is that this represents an atypical preeclampsia / HELLP picture.  Concur with Magnesium sulfate prophylaxis.  Would recommend administering the second dose of betamethasone now.  Recommendations: 1) NICU consultation  - feel that this is a very guarded prognosis for the newborn whether or not delivery occurs here or at another facility 2) Given what is likely an atypical preeclampsia / HELLP picture feel that there are sufficient evidence to proceed with delivery for maternal indications.  Recommendations were discussed with Dr. Erin Fulling   Thank you for the opportunity to be a part of the care of [facility]. Please contact our office if we can be of further assistance.   I spent approximately 30 minutes with this patient with over 50% of time spent in face-to-face counseling.  Alpha Gula, MD Maternal Fetal Medicine

## 2017-01-11 NOTE — Progress Notes (Signed)
CSW received consult for issues with childcare.  Unfortunately, CSW cannot assist with this issue.  CSW is screening out referral.

## 2017-01-11 NOTE — Progress Notes (Addendum)
[facility] COMPREHENSIVE PROGRESS NOTE  Terry Richardson is a 26 y.o. G2P1001 at 23w1dwho is admitted for elevated LFTs, elevated blood pressures concerning for severe preeclampsia.  Also has nausea and vomiting. Fetal genetic screening remarkable for high risk of Trisomy 21 on NIPS, declined amniocentesis. Fetus with anomalies including complex cardiac anomaly, ?A-V canal defect. MFM and Neonatology consulted. Estimated Date of Delivery: 03/28/17 Fetal presentation is unsure.  Length of Stay:  1 Days. Admitted 01/10/2017  Subjective: Was able to tolerate bites of food. One episode of emesis overnight. Denies headaches, visual symptoms, abdominal pain. Patient reports good fetal movement.  She reports no uterine contractions, no bleeding and no loss of fluid per vagina.  Vitals:  Blood pressure (!) 130/56, pulse 94, temperature 97.9 F (36.6 C), temperature source Oral, resp. rate 16, height 5' 4"  (1.626 m), weight 184 lb (83.5 kg), last menstrual period 07/12/2016, SpO2 97 %.  Patient Vitals for the past 24 hrs:  BP Temp Temp src Pulse Resp SpO2 Height Weight  01/11/17 0600 - - - - 16 - - -  01/11/17 0500 - - - - 18 - - -  01/11/17 0406 (!) 130/56 97.9 F (36.6 C) Oral 94 18 97 % - -  01/11/17 0300 - - - - 18 - - -  01/11/17 0200 - - - - 16 - - -  01/11/17 0104 (!) 99/46 - - 94 16 97 % - -  01/11/17 0000 135/64 98 F (36.7 C) Oral (!) 102 16 98 % - -  01/10/17 2311 132/65 97.4 F (36.3 C) Oral (!) 111 16 99 % - -  01/10/17 2207 134/69 - - (!) 115 16 98 % - -  01/10/17 2109 (!) 144/68 - - (!) 125 18 98 % - -  01/10/17 1954 131/75 98.2 F (36.8 C) Oral (!) 119 16 97 % - -  01/10/17 1731 117/65 - - (!) 119 - - - -  01/10/17 1716 134/64 - - (!) 121 - - - -  01/10/17 1712 125/64 - - (!) 114 - - - -  01/10/17 1710 125/62 - - (!) 113 - - - -  01/10/17 1705 121/60 - - (!) 110 - - - -  01/10/17 1602 (!) 114/50 - - (!) 117 - - - -  01/10/17 1600 (!) 114/50 98.6 F (37 C) Oral (!)  117 18 98 % - -  01/10/17 1500 - - - - - 99 % - -  01/10/17 1455 - - - - - 99 % - -  01/10/17 1400 - - - - - 97 % - -  01/10/17 1350 - - - - - 97 % - -  01/10/17 1243 - - - - - 99 % - -  01/10/17 1238 - - - - - 99 % - -  01/10/17 1235 - - - - - 96 % - -  01/10/17 1228 129/77 97.9 F (36.6 C) - (!) 114 20 - - -  01/10/17 1137 - - - (!) 106 - 98 % - -  01/10/17 1130 138/69 - - (!) 103 - 99 % - -  01/10/17 1120 (!) 141/58 - - (!) 106 16 98 % - -  01/10/17 1115 (!) 141/58 - - (!) 109 - 99 % - -  01/10/17 1045 (!) 144/85 - - (!) 108 - 99 % - -  01/10/17 1030 131/85 - - (!) 111 - 100 % - -  01/10/17 1000 133/83 - - (Marland Kitchen  111 - 100 % - -  01/10/17 0945 140/81 - - (!) 109 - 99 % - -  01/10/17 0912 - - - - - - 5' 4"  (1.626 m) 184 lb (83.5 kg)  01/10/17 0911 133/87 97.4 F (36.3 C) Oral (!) 126 18 98 % - -    Physical Examination: CONSTITUTIONAL: Well-developed, well-nourished female in no acute distress.  HENT:  Normocephalic, atraumatic, External right and left ear normal. Oropharynx is clear and moist EYES: Conjunctivae and EOM are normal. Pupils are equal, round, and reactive to light. No scleral icterus.  NECK: Normal range of motion, supple, no masses SKIN: Skin is warm and dry. No rash noted. Not diaphoretic. No erythema. No pallor. [place]: Alert and oriented to person, place, and time. Normal reflexes, muscle tone coordination. No cranial nerve deficit noted. PSYCHIATRIC: Normal mood and affect. Normal behavior. Normal judgment and thought content. CARDIOVASCULAR: Normal heart rate noted, regular rhythm RESPIRATORY: Effort and breath sounds normal, no problems with respiration noted MUSCULOSKELETAL: Normal range of motion. No edema and no tenderness. 2+ DTRs ABDOMEN: Soft, nontender, nondistended, gravid. CERVIX:  Deferred  Fetal monitoring: FHR: 120 bpm, Variability: moderate, Accelerations: Present, Decelerations: Absent  Uterine activity: No contractions   CMP Latest Ref Rng  & Units 01/11/2017 01/10/2017 01/10/2017  Glucose 65 - 99 mg/dL 146(H) 77 95  BUN 6 - 20 mg/dL PENDING 8 7  Creatinine 0.44 - 1.00 mg/dL 0.50 0.47 0.70  Sodium 135 - 145 mmol/L 136 136 138  Potassium 3.5 - 5.1 mmol/L 3.0(L) 3.2(L) 3.0(L)  Chloride 101 - 111 mmol/L 107 107 104  CO2 22 - 32 mmol/L 18(L) 16(L) 18(L)  Calcium 8.9 - 10.3 mg/dL 7.8(L) 8.8(L) 9.5  Total Protein 6.5 - 8.1 g/dL 5.5(L) 5.9(L) 6.7  Total Bilirubin 0.3 - 1.2 mg/dL 1.4(H) 2.1(H) 2.4(H)  Alkaline Phos 38 - 126 U/L 88 89 109  AST 15 - 41 U/L 228(H) 227(H) 272(H)  ALT 14 - 54 U/L 271(H) 254(H) 298(H)    Results for orders placed or performed during the hospital encounter of 01/10/17 (from the past 48 hour(s))  Lipase, blood     Status: None   Collection Time: 01/10/17  9:18 AM  Result Value Ref Range   Lipase 13 11 - 51 U/L  Comprehensive metabolic panel     Status: Abnormal   Collection Time: 01/10/17  9:18 AM  Result Value Ref Range   Sodium 138 135 - 145 mmol/L   Potassium 3.0 (L) 3.5 - 5.1 mmol/L   Chloride 104 101 - 111 mmol/L   CO2 18 (L) 22 - 32 mmol/L   Glucose, Bld 95 65 - 99 mg/dL   BUN 7 6 - 20 mg/dL   Creatinine, Ser 0.70 0.44 - 1.00 mg/dL   Calcium 9.5 8.9 - 10.3 mg/dL   Total Protein 6.7 6.5 - 8.1 g/dL   Albumin 3.1 (L) 3.5 - 5.0 g/dL   AST 272 (H) 15 - 41 U/L   ALT 298 (H) 14 - 54 U/L   Alkaline Phosphatase 109 38 - 126 U/L   Total Bilirubin 2.4 (H) 0.3 - 1.2 mg/dL   GFR calc non Af Amer >60 >60 mL/min   GFR calc Af Amer >60 >60 mL/min    Comment: (NOTE) The eGFR has been calculated using the CKD EPI equation. This calculation has not been validated in all clinical situations. eGFR's persistently <60 mL/min signify possible Chronic Kidney Disease.    Anion gap 16 (H) 5 - 15  CBC     Status: Abnormal   Collection Time: 01/10/17  9:18 AM  Result Value Ref Range   WBC 13.8 (H) 4.0 - 10.5 K/uL   RBC 3.69 (L) 3.87 - 5.11 MIL/uL   Hemoglobin 10.8 (L) 12.0 - 15.0 g/dL   HCT 32.9 (L) 36.0 -  46.0 %   MCV 89.2 78.0 - 100.0 fL   MCH 29.3 26.0 - 34.0 pg   MCHC 32.8 30.0 - 36.0 g/dL   RDW 13.3 11.5 - 15.5 %   Platelets 350 150 - 400 K/uL  Urinalysis, Routine w reflex microscopic     Status: Abnormal   Collection Time: 01/10/17 11:00 AM  Result Value Ref Range   Color, Urine AMBER (A) YELLOW    Comment: BIOCHEMICALS MAY BE AFFECTED BY COLOR   APPearance HAZY (A) CLEAR   Specific Gravity, Urine 1.019 1.005 - 1.030   pH 6.0 5.0 - 8.0   Glucose, UA NEGATIVE NEGATIVE mg/dL   Hgb urine dipstick NEGATIVE NEGATIVE   Bilirubin Urine NEGATIVE NEGATIVE   Ketones, ur 80 (A) NEGATIVE mg/dL   Protein, ur 30 (A) NEGATIVE mg/dL   Nitrite NEGATIVE NEGATIVE   Leukocytes, UA NEGATIVE NEGATIVE   RBC / HPF 0-5 0 - 5 RBC/hpf   WBC, UA 6-30 0 - 5 WBC/hpf   Bacteria, UA RARE (A) NONE SEEN   Squamous Epithelial / LPF 6-30 (A) NONE SEEN   Mucous PRESENT    Hyaline Casts, UA PRESENT   Rapid urine drug screen (hospital performed)     Status: Abnormal   Collection Time: 01/10/17 11:00 AM  Result Value Ref Range   Opiates NONE DETECTED NONE DETECTED   Cocaine NONE DETECTED NONE DETECTED   Benzodiazepines NONE DETECTED NONE DETECTED   Amphetamines NONE DETECTED NONE DETECTED   Tetrahydrocannabinol POSITIVE (A) NONE DETECTED   Barbiturates NONE DETECTED NONE DETECTED    Comment:        DRUG SCREEN FOR MEDICAL PURPOSES ONLY.  IF CONFIRMATION IS NEEDED FOR ANY PURPOSE, NOTIFY LAB WITHIN 5 DAYS.        LOWEST DETECTABLE LIMITS FOR URINE DRUG SCREEN Drug Class       Cutoff (ng/mL) Amphetamine      1000 Barbiturate      200 Benzodiazepine   947 Tricyclics       654 Opiates          300 Cocaine          300 THC              50   Protein / creatinine ratio, urine     Status: Abnormal   Collection Time: 01/10/17 11:00 AM  Result Value Ref Range   Creatinine, Urine 137.47 mg/dL   Total Protein, Urine 52 mg/dL    Comment: NO NORMAL RANGE ESTABLISHED FOR THIS TEST   Protein Creatinine Ratio  0.38 (H) 0.00 - 0.15 mg/mg[Cre]  DIC (disseminated intravasc coag) panel     Status: Abnormal   Collection Time: 01/10/17 11:35 AM  Result Value Ref Range   Prothrombin Time 14.2 11.4 - 15.2 seconds   INR 1.09    aPTT 24 24 - 36 seconds   Fibrinogen 565 (H) 210 - 475 mg/dL   D-Dimer, Quant 1.22 (H) 0.00 - 0.50 ug/mL-FEU    Comment: (NOTE) At the manufacturer cut-off of 0.50 ug/mL FEU, this assay has been documented to exclude PE with a sensitivity and negative predictive value of 97 to 99%.  At  this time, this assay has not been approved by the FDA to exclude DVT/VTE. Results should be correlated with clinical presentation.    Platelets 283 150 - 400 K/uL   Smear Review NO SCHISTOCYTES SEEN   Amylase     Status: None   Collection Time: 01/10/17 11:35 AM  Result Value Ref Range   Amylase 29 28 - 100 U/L  Hepatitis panel, acute     Status: None   Collection Time: 01/10/17 12:00 PM  Result Value Ref Range   Hepatitis B Surface Ag Negative Negative   HCV Ab 0.1 0.0 - 0.9 s/co ratio    Comment: (NOTE)                                  Negative:     < 0.8                             Indeterminate: 0.8 - 0.9                                  Positive:     > 0.9 The CDC recommends that a positive HCV antibody result be followed up with a HCV Nucleic Acid Amplification test (109323). Performed At: [facility] [place], [place] [id number] Lindon Romp MD [id number]    Hep A IgM Negative Negative   Hep B C IgM Negative Negative  Type and screen [place]     Status: None   Collection Time: 01/10/17  1:30 PM  Result Value Ref Range   ABO/RH(D) O POS    Antibody Screen NEG    Sample Expiration 01/13/2017   ABO/Rh     Status: None   Collection Time: 01/10/17  1:30 PM  Result Value Ref Range   ABO/RH(D) O POS   Comprehensive metabolic panel     Status: Abnormal   Collection Time: 01/10/17  4:55 PM  Result Value Ref Range   Sodium  136 135 - 145 mmol/L   Potassium 3.2 (L) 3.5 - 5.1 mmol/L   Chloride 107 101 - 111 mmol/L   CO2 16 (L) 22 - 32 mmol/L   Glucose, Bld 77 65 - 99 mg/dL   BUN 8 6 - 20 mg/dL   Creatinine, Ser 0.47 0.44 - 1.00 mg/dL   Calcium 8.8 (L) 8.9 - 10.3 mg/dL   Total Protein 5.9 (L) 6.5 - 8.1 g/dL   Albumin 2.5 (L) 3.5 - 5.0 g/dL   AST 227 (H) 15 - 41 U/L   ALT 254 (H) 14 - 54 U/L   Alkaline Phosphatase 89 38 - 126 U/L   Total Bilirubin 2.1 (H) 0.3 - 1.2 mg/dL   GFR calc non Af Amer >60 >60 mL/min   GFR calc Af Amer >60 >60 mL/min    Comment: (NOTE) The eGFR has been calculated using the CKD EPI equation. This calculation has not been validated in all clinical situations. eGFR's persistently <60 mL/min signify possible Chronic Kidney Disease.    Anion gap 13 5 - 15  CBC     Status: Abnormal   Collection Time: 01/10/17  4:55 PM  Result Value Ref Range   WBC 13.4 (H) 4.0 - 10.5 K/uL   RBC 3.03 (L) 3.87 - 5.11 MIL/uL   Hemoglobin  9.3 (L) 12.0 - 15.0 g/dL   HCT 27.3 (L) 36.0 - 46.0 %   MCV 90.1 78.0 - 100.0 fL   MCH 30.7 26.0 - 34.0 pg   MCHC 34.1 30.0 - 36.0 g/dL   RDW 13.8 11.5 - 15.5 %   Platelets 271 150 - 400 K/uL  Comprehensive metabolic panel     Status: Abnormal (Preliminary result)   Collection Time: 01/11/17  6:05 AM  Result Value Ref Range   Sodium 136 135 - 145 mmol/L   Potassium 3.0 (L) 3.5 - 5.1 mmol/L   Chloride 107 101 - 111 mmol/L   CO2 18 (L) 22 - 32 mmol/L   Glucose, Bld 146 (H) 65 - 99 mg/dL   BUN PENDING 6 - 20 mg/dL   Creatinine, Ser 0.50 0.44 - 1.00 mg/dL   Calcium 7.8 (L) 8.9 - 10.3 mg/dL   Total Protein 5.5 (L) 6.5 - 8.1 g/dL   Albumin 2.5 (L) 3.5 - 5.0 g/dL   AST 228 (H) 15 - 41 U/L   ALT 271 (H) 14 - 54 U/L   Alkaline Phosphatase 88 38 - 126 U/L   Total Bilirubin 1.4 (H) 0.3 - 1.2 mg/dL   GFR calc non Af Amer >60 >60 mL/min   GFR calc Af Amer >60 >60 mL/min    Comment: (NOTE) The eGFR has been calculated using the CKD EPI equation. This calculation  has not been validated in all clinical situations. eGFR's persistently <60 mL/min signify possible Chronic Kidney Disease.    Anion gap 11 5 - 15  CBC     Status: Abnormal   Collection Time: 01/11/17  6:05 AM  Result Value Ref Range   WBC 12.4 (H) 4.0 - 10.5 K/uL   RBC 2.91 (L) 3.87 - 5.11 MIL/uL   Hemoglobin 9.0 (L) 12.0 - 15.0 g/dL   HCT 25.9 (L) 36.0 - 46.0 %   MCV 89.0 78.0 - 100.0 fL   MCH 30.9 26.0 - 34.0 pg   MCHC 34.7 30.0 - 36.0 g/dL   RDW 13.6 11.5 - 15.5 %   Platelets 265 150 - 400 K/uL    US Abdomen Limited Ruq  Result Date: 01/10/2017 CLINICAL DATA:  Chronic kidney disease.  Right upper quadrant pain. EXAM: US ABDOMEN LIMITED - RIGHT UPPER QUADRANT COMPARISON:  None. FINDINGS: Gallbladder: No gallstones or wall thickening visualized. Small amount of gallbladder sludge. No sonographic Murphy sign noted by sonographer. Common bile duct: Diameter: 3 mm Liver: No focal lesion identified. Within normal limits in parenchymal echogenicity. Other:  Severe right hydronephrosis partially visualize. IMPRESSION: 1. No cholelithiasis or sonographic evidence of acute cholecystitis. 2. Severe right hydronephrosis of uncertain etiology. Electronically Signed   By: Kathreen Devoid   On: 01/10/2017 12:04  Dictation #1 [id number]  [id number]   Current scheduled medications . betamethasone acetate-betamethasone sodium phosphate  12 mg Intramuscular Q24H  . docusate sodium  100 mg Oral Daily  . famotidine  20 mg Oral BID  . prenatal multivitamin  1 tablet Oral Q1200    I have reviewed the patient's current medications.  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 On magnesium sulfate for now (started 1700 5/24). Will discontinue after 24 hours if labs improve and there are no concerning  symptoms. Will follow up labs. LFTs plateauing. Normal coagulation  panel, negative hepatitis. Continue to monitor. Recheck in 12 hours. Antiemetics ordered for N/V; diet as tolerated MFM and Neonatology consulted about fetal anomalies; ultrasound and fetal ECHO ordered. Will follow up results and manage accordingly. Continue betamethasone regimen. Continue routine antenatal care.   Verita Schneiders, MD Attending [place], [facility]

## 2017-01-11 NOTE — Consult Note (Signed)
Neonatology Consult Note:  At the request of the patients obstetrician Dr. 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  An ultrasound today showed fetal hydrops - polyhydramnios, diffuse skin edema, pleural effusion and ascites, BPP 6/8 (-2 for absent fetal breathing).  The estimated fetal weight is > 90th %tile, but likely overestimated due to diffuse skin edema.  Suspected VSD on outside ultrasound.  She has completed her first dose of betamethasone and is scheduled for her second injection this afternoon.  We discussed fetal hydrops and discussed the severe compromise to cardiac and pulmonary function which can occur in the setting of severe hydrops.  We discussed mechanical ventilation as well as the potential need for thoracentesis or paracentesis.  We also dicussed the role of AV canal defects / VSD and generalized prematurity in the overall care.  She is very understanding of the guarded prognosis and stated that she does not want him to suffer.  We discussed assessing him at the time of delivery, intubating and potentially removing pleural fluid if needed.  Thank you for allowing [place] to participate in her care.  Please call with questions.  Higinio Roger, DO  Neonatologist  The total length of face-to-face or floor / unit time for this encounter was 30 minutes.  Counseling and / or coordination of care was greater than fifty percent of the time.

## 2017-01-11 NOTE — Progress Notes (Signed)
Labor Progress Note Lelon Frohlichisha Shugrue is a 26 y.o. G2P1001 at 3615w1d presented for Severe Pre-E by LFTs > 2xULN and found to have fetal hydrops fetalis.   S: Starting to feel contractions but pain well-controlled with Fentanyl.   Denies headache, vision changes, scotoma, epigastric/RUQ abdominal pain, dyspnea, edema.   O:  BP 134/69   Pulse (!) 105   Temp 98.2 F (36.8 C) (Oral)   Resp 18   Ht 5\' 4"  (1.626 m)   Wt 184 lb (83.5 kg)   LMP 07/12/2016 (Exact Date)   SpO2 99%   BMI 31.58 kg/m  EFM: 115/min variability/10x10 accels  CVE: Dilation: 3 Presentation: Vertex Exam by: RN  A&P: 26 y.o. G2P1001 1015w1d admitted for severe Pre-E by LFT abnormalities and fetus with hydrops fetalis as well as NIPT positive for Trisomy 21 and suspected VSD.  #IOL: Negative CST.  IOL with FB followed by pitocin. FB remains in place. Classical and LTCS have been discussed with the patient should fetal intolerance or labor complications arise.  #Pre-E with SF by LFTs > 2xULN. On Mg gtt. Asymptomatic currently. Pressures hypo- to normotensive. No signs of Mg toxicity. Adequate UOP.  - Continue Mg gtt for 24 hours PP - Trend CBC and CMP at least daily  #Pain: Eventual epidural.  #FWB: Hydrops fetalis, Trisomy 21, and VSD. S/p NICU and MFM consults. S/p BMZ x 2 (05/24-05/25). Recommend proceeding with IOL for maternal indications. Guarded fetal prognosis at this time. NICU will be present for delivery and necessary resuscitative measures.  #GBS: Pending. Will await culture results until FB out. If starting pit will start PCN ppx at least until culture results.   Tarri AbernethyAbigail J Taliah Porche, MD 11:15 PM

## 2017-01-11 NOTE — Progress Notes (Signed)
Terry Richardson was feeling a lot of stress as she waited for her family to arrive to help her with her daughter, while also waiting to see what her medical plan would be.  I offered emotional and spiritual support as she waited and offered logistical help with her 26 year-old so she could focus on her baby and on the situation at hand.  She described that she had made peace with her baby's multiple anomalies, but that above all, she did not want her baby to suffer.  I affirmed that she made her decisions in a loving way.  Chaplain Dyanne CarrelKaty Ivi Griffith, Bcc Pager, [phone] 3:20 PM    01/11/17 1500  Clinical Encounter Type  Visited With Patient and family together  Visit Type Spiritual support

## 2017-01-12 ENCOUNTER — Inpatient Hospital Stay (HOSPITAL_COMMUNITY): Payer: Medicaid Other | Admitting: Anesthesiology

## 2017-01-12 ENCOUNTER — Encounter (HOSPITAL_COMMUNITY): Payer: Self-pay | Admitting: *Deleted

## 2017-01-12 DIAGNOSIS — O99824 Streptococcus B carrier state complicating childbirth: Secondary | ICD-10-CM

## 2017-01-12 DIAGNOSIS — O1424 HELLP syndrome, complicating childbirth: Secondary | ICD-10-CM

## 2017-01-12 DIAGNOSIS — Z3A29 29 weeks gestation of pregnancy: Secondary | ICD-10-CM

## 2017-01-12 LAB — COMPREHENSIVE METABOLIC PANEL
ALBUMIN: 2.3 g/dL — AB (ref 3.5–5.0)
ALBUMIN: 2.6 g/dL — AB (ref 3.5–5.0)
ALK PHOS: 82 U/L (ref 38–126)
ALK PHOS: 90 U/L (ref 38–126)
ALT: 427 U/L — ABNORMAL HIGH (ref 14–54)
ALT: 475 U/L — ABNORMAL HIGH (ref 14–54)
ALT: 471 U/L — ABNORMAL HIGH (ref 14–54)
ANION GAP: 9 (ref 5–15)
ANION GAP: 8 (ref 5–15)
ANION GAP: 9 (ref 5–15)
AST: 390 U/L — AB (ref 15–41)
AST: 436 U/L — ABNORMAL HIGH (ref 15–41)
AST: 412 U/L — ABNORMAL HIGH (ref 15–41)
Albumin: 2.5 g/dL — ABNORMAL LOW (ref 3.5–5.0)
Alkaline Phosphatase: 96 U/L (ref 38–126)
BUN: <5 mg/dL — ABNORMAL LOW (ref 6–20)
BUN: <5 mg/dL — ABNORMAL LOW (ref 6–20)
BUN: <5 mg/dL — ABNORMAL LOW (ref 6–20)
CALCIUM: 7.1 mg/dL — AB (ref 8.9–10.3)
CALCIUM: 7.3 mg/dL — AB (ref 8.9–10.3)
CHLORIDE: 107 mmol/L (ref 101–111)
CO2: 22 mmol/L (ref 22–32)
CO2: 24 mmol/L (ref 22–32)
CO2: 23 mmol/L (ref 22–32)
Calcium: 7.3 mg/dL — ABNORMAL LOW (ref 8.9–10.3)
Chloride: 106 mmol/L (ref 101–111)
Chloride: 104 mmol/L (ref 101–111)
Creatinine, Ser: 0.51 mg/dL (ref 0.44–1.00)
Creatinine, Ser: 0.52 mg/dL (ref 0.44–1.00)
Creatinine, Ser: 0.53 mg/dL (ref 0.44–1.00)
GFR calc Af Amer: >60 mL/min (ref >60)
GFR calc non Af Amer: >60 mL/min (ref >60)
GFR calc non Af Amer: >60 mL/min (ref >60)
GLUCOSE: 124 mg/dL — AB (ref 65–99)
Glucose, Bld: 115 mg/dL — ABNORMAL HIGH (ref 65–99)
Glucose, Bld: 112 mg/dL — ABNORMAL HIGH (ref 65–99)
POTASSIUM: 2.7 mmol/L — AB (ref 3.5–5.1)
Potassium: 2.9 mmol/L — ABNORMAL LOW (ref 3.5–5.1)
Potassium: 2.9 mmol/L — ABNORMAL LOW (ref 3.5–5.1)
SODIUM: 137 mmol/L (ref 135–145)
SODIUM: 139 mmol/L (ref 135–145)
SODIUM: 136 mmol/L (ref 135–145)
Total Bilirubin: 0.8 mg/dL (ref 0.3–1.2)
Total Bilirubin: 1.1 mg/dL (ref 0.3–1.2)
Total Bilirubin: 0.7 mg/dL (ref 0.3–1.2)
Total Protein: 5.2 g/dL — ABNORMAL LOW (ref 6.5–8.1)
Total Protein: 6.1 g/dL — ABNORMAL LOW (ref 6.5–8.1)
Total Protein: 5.7 g/dL — ABNORMAL LOW (ref 6.5–8.1)

## 2017-01-12 LAB — CBC WITH DIFFERENTIAL/PLATELET
BASOS PCT: 0 %
Basophils Absolute: 0 10*3/uL (ref 0.0–0.1)
EOS ABS: 0 10*3/uL (ref 0.0–0.7)
Eosinophils Relative: 0 %
HCT: 25.5 % — ABNORMAL LOW (ref 36.0–46.0)
Hemoglobin: 8.5 g/dL — ABNORMAL LOW (ref 12.0–15.0)
LYMPHS ABS: 2 10*3/uL (ref 0.7–4.0)
LYMPHS PCT: 11 %
MCH: 30.1 pg (ref 26.0–34.0)
MCHC: 33.3 g/dL (ref 30.0–36.0)
MCV: 90.4 fL (ref 78.0–100.0)
MONO ABS: 2.3 10*3/uL — AB (ref 0.1–1.0)
Monocytes Relative: 13 %
NEUTROS ABS: 13.5 10*3/uL — AB (ref 1.7–7.7)
Neutrophils Relative %: 76 %
OTHER: 0 %
PLATELETS: 241 10*3/uL (ref 150–400)
RBC: 2.82 MIL/uL — ABNORMAL LOW (ref 3.87–5.11)
RDW: 13.6 % (ref 11.5–15.5)
WBC: 17.8 10*3/uL — ABNORMAL HIGH (ref 4.0–10.5)

## 2017-01-12 LAB — CBC
HCT: 25.9 % — ABNORMAL LOW (ref 36.0–46.0)
HEMATOCRIT: 23.7 % — AB (ref 36.0–46.0)
HEMATOCRIT: 26.2 % — AB (ref 36.0–46.0)
HEMATOCRIT: 26.2 % — AB (ref 36.0–46.0)
HEMOGLOBIN: 8.2 g/dL — AB (ref 12.0–15.0)
HEMOGLOBIN: 8.6 g/dL — AB (ref 12.0–15.0)
HEMOGLOBIN: 8.9 g/dL — AB (ref 12.0–15.0)
HEMOGLOBIN: 8.9 g/dL — AB (ref 12.0–15.0)
MCH: 30 pg (ref 26.0–34.0)
MCH: 30.6 pg (ref 26.0–34.0)
MCH: 30.6 pg (ref 26.0–34.0)
MCH: 30.9 pg (ref 26.0–34.0)
MCHC: 33.2 g/dL (ref 30.0–36.0)
MCHC: 34 g/dL (ref 30.0–36.0)
MCHC: 34 g/dL (ref 30.0–36.0)
MCHC: 34.6 g/dL (ref 30.0–36.0)
MCV: 89.4 fL (ref 78.0–100.0)
MCV: 90 fL (ref 78.0–100.0)
MCV: 90 fL (ref 78.0–100.0)
MCV: 90.2 fL (ref 78.0–100.0)
PLATELETS: 233 10*3/uL (ref 150–400)
Platelets: 241 10*3/uL (ref 150–400)
Platelets: 284 10*3/uL (ref 150–400)
Platelets: 284 10*3/uL (ref 150–400)
RBC: 2.65 MIL/uL — ABNORMAL LOW (ref 3.87–5.11)
RBC: 2.87 MIL/uL — ABNORMAL LOW (ref 3.87–5.11)
RBC: 2.91 MIL/uL — AB (ref 3.87–5.11)
RBC: 2.91 MIL/uL — ABNORMAL LOW (ref 3.87–5.11)
RDW: 13.9 % (ref 11.5–15.5)
RDW: 14 % (ref 11.5–15.5)
RDW: 14 % (ref 11.5–15.5)
RDW: 14 % (ref 11.5–15.5)
WBC: 11.3 10*3/uL — ABNORMAL HIGH (ref 4.0–10.5)
WBC: 12.2 10*3/uL — ABNORMAL HIGH (ref 4.0–10.5)
WBC: 12.5 10*3/uL — ABNORMAL HIGH (ref 4.0–10.5)
WBC: 12.8 10*3/uL — ABNORMAL HIGH (ref 4.0–10.5)

## 2017-01-12 LAB — OB RESULTS CONSOLE GBS: GBS: POSITIVE

## 2017-01-12 LAB — HIV ANTIBODY (ROUTINE TESTING W REFLEX): HIV Screen 4th Generation wRfx: NONREACTIVE

## 2017-01-12 LAB — PREPARE RBC (CROSSMATCH)

## 2017-01-12 LAB — RPR: RPR: NONREACTIVE

## 2017-01-12 MED ORDER — FENTANYL 2.5 MCG/ML BUPIVACAINE 1/10 % EPIDURAL INFUSION (WH - ANES)
14.0000 mL/h | INTRAMUSCULAR | Status: DC | PRN
  Administered 2017-01-12 (×4): 14 mL/h via EPIDURAL
  Filled 2017-01-12 (×3): qty 100

## 2017-01-12 MED ORDER — LACTATED RINGERS IV SOLN
500.0000 mL | Freq: Once | INTRAVENOUS | Status: AC
  Administered 2017-01-12: 500 mL via INTRAVENOUS

## 2017-01-12 MED ORDER — EPHEDRINE 5 MG/ML INJ
10.0000 mg | INTRAVENOUS | Status: DC | PRN
  Filled 2017-01-12: qty 2

## 2017-01-12 MED ORDER — PHENYLEPHRINE 40 MCG/ML (10ML) SYRINGE FOR IV PUSH (FOR BLOOD PRESSURE SUPPORT)
80.0000 ug | PREFILLED_SYRINGE | INTRAVENOUS | Status: DC | PRN
  Filled 2017-01-12: qty 5

## 2017-01-12 MED ORDER — POTASSIUM CHLORIDE CRYS ER 20 MEQ PO TBCR
40.0000 meq | EXTENDED_RELEASE_TABLET | Freq: Three times a day (TID) | ORAL | Status: DC
  Administered 2017-01-12 (×3): 40 meq via ORAL
  Filled 2017-01-12 (×4): qty 2

## 2017-01-12 MED ORDER — WITCH HAZEL-GLYCERIN EX PADS: 1.0000 "application " | MEDICATED_PAD | CUTANEOUS | Status: DC | PRN

## 2017-01-12 MED ORDER — SIMETHICONE 80 MG PO CHEW: 80.0000 mg | CHEWABLE_TABLET | ORAL | Status: DC | PRN

## 2017-01-12 MED ORDER — TETANUS-DIPHTH-ACELL PERTUSSIS 5-2.5-18.5 LF-MCG/0.5 IM SUSP: 0.5000 mL | Freq: Once | INTRAMUSCULAR | Status: DC

## 2017-01-12 MED ORDER — PHENYLEPHRINE 40 MCG/ML (10ML) SYRINGE FOR IV PUSH (FOR BLOOD PRESSURE SUPPORT)
PREFILLED_SYRINGE | INTRAVENOUS | Status: AC
  Filled 2017-01-12: qty 20

## 2017-01-12 MED ORDER — ONDANSETRON HCL 4 MG PO TABS: 4.0000 mg | ORAL_TABLET | ORAL | Status: DC | PRN

## 2017-01-12 MED ORDER — MAGNESIUM SULFATE 40 G IN LACTATED RINGERS - SIMPLE
2.0000 g/h | INTRAVENOUS | Status: DC
  Administered 2017-01-13: 2 g/h via INTRAVENOUS
  Filled 2017-01-12: qty 500
  Filled 2017-01-12: qty 40

## 2017-01-12 MED ORDER — FLEET ENEMA 7-19 GM/118ML RE ENEM: 1.0000 | ENEMA | Freq: Every day | RECTAL | Status: DC | PRN

## 2017-01-12 MED ORDER — SENNOSIDES-DOCUSATE SODIUM 8.6-50 MG PO TABS
2.0000 | ORAL_TABLET | ORAL | Status: DC
  Administered 2017-01-12: 2 via ORAL
  Filled 2017-01-12: qty 2

## 2017-01-12 MED ORDER — ACETAMINOPHEN 325 MG PO TABS: 650.0000 mg | ORAL_TABLET | ORAL | Status: DC | PRN

## 2017-01-12 MED ORDER — ZOLPIDEM TARTRATE 5 MG PO TABS: 5.0000 mg | ORAL_TABLET | Freq: Every evening | ORAL | Status: DC | PRN

## 2017-01-12 MED ORDER — SODIUM CHLORIDE 0.9% FLUSH: 3.0000 mL | Freq: Two times a day (BID) | INTRAVENOUS | Status: DC

## 2017-01-12 MED ORDER — LACTATED RINGERS IV SOLN: 500.0000 mL | Freq: Once | INTRAVENOUS | Status: DC

## 2017-01-12 MED ORDER — PRENATAL MULTIVITAMIN CH
1.0000 | ORAL_TABLET | Freq: Every day | ORAL | Status: DC
  Administered 2017-01-13: 1 via ORAL
  Filled 2017-01-12: qty 1

## 2017-01-12 MED ORDER — PHENYLEPHRINE 40 MCG/ML (10ML) SYRINGE FOR IV PUSH (FOR BLOOD PRESSURE SUPPORT): 80.0000 ug | PREFILLED_SYRINGE | INTRAVENOUS | Status: DC | PRN

## 2017-01-12 MED ORDER — SODIUM CHLORIDE 0.9 % IV SOLN: 250.0000 mL | INTRAVENOUS | Status: DC | PRN

## 2017-01-12 MED ORDER — DIPHENHYDRAMINE HCL 50 MG/ML IJ SOLN: 12.5000 mg | INTRAMUSCULAR | Status: DC | PRN

## 2017-01-12 MED ORDER — FENTANYL 2.5 MCG/ML BUPIVACAINE 1/10 % EPIDURAL INFUSION (WH - ANES)
INTRAMUSCULAR | Status: AC
  Filled 2017-01-12: qty 100

## 2017-01-12 MED ORDER — BENZOCAINE-MENTHOL 20-0.5 % EX AERO
1.0000 "application " | INHALATION_SPRAY | CUTANEOUS | Status: DC | PRN
  Administered 2017-01-13: 1 via TOPICAL
  Filled 2017-01-12: qty 56

## 2017-01-12 MED ORDER — IBUPROFEN 600 MG PO TABS
600.0000 mg | ORAL_TABLET | Freq: Four times a day (QID) | ORAL | Status: DC | PRN
  Administered 2017-01-12 – 2017-01-13 (×3): 600 mg via ORAL
  Filled 2017-01-12 (×3): qty 1

## 2017-01-12 MED ORDER — EPHEDRINE 5 MG/ML INJ: 10.0000 mg | INTRAVENOUS | Status: DC | PRN

## 2017-01-12 MED ORDER — SODIUM CHLORIDE 0.9% FLUSH
3.0000 mL | INTRAVENOUS | Status: DC | PRN
Start: 2017-01-12 — End: 2017-01-13

## 2017-01-12 MED ORDER — ONDANSETRON HCL 4 MG/2ML IJ SOLN: 4.0000 mg | INTRAMUSCULAR | Status: DC | PRN

## 2017-01-12 MED ORDER — BISACODYL 10 MG RE SUPP: 10.0000 mg | Freq: Every day | RECTAL | Status: DC | PRN

## 2017-01-12 MED ORDER — COCONUT OIL OIL: 1.0000 "application " | TOPICAL_OIL | Status: DC | PRN

## 2017-01-12 MED ORDER — DIPHENHYDRAMINE HCL 25 MG PO CAPS: 25.0000 mg | ORAL_CAPSULE | Freq: Four times a day (QID) | ORAL | Status: DC | PRN

## 2017-01-12 MED ORDER — SODIUM CHLORIDE 0.9 % IV SOLN: Freq: Once | INTRAVENOUS | Status: DC

## 2017-01-12 MED ORDER — IBUPROFEN 600 MG PO TABS
600.0000 mg | ORAL_TABLET | Freq: Four times a day (QID) | ORAL | Status: DC
  Administered 2017-01-12: 600 mg via ORAL
  Filled 2017-01-12: qty 1

## 2017-01-12 MED ORDER — DIBUCAINE 1 % RE OINT: 1.0000 "application " | TOPICAL_OINTMENT | RECTAL | Status: DC | PRN

## 2017-01-12 MED ORDER — SODIUM BICARBONATE 8.4 % IV SOLN
INTRAVENOUS | Status: DC | PRN
  Administered 2017-01-12: 5 mL via EPIDURAL

## 2017-01-12 MED ORDER — LIDOCAINE HCL (PF) 1 % IJ SOLN
INTRAMUSCULAR | Status: DC | PRN
  Administered 2017-01-12: 4 mL via EPIDURAL

## 2017-01-12 MED ORDER — POTASSIUM CHLORIDE 10 MEQ/100ML IV SOLN
10.0000 meq | INTRAVENOUS | Status: AC
  Administered 2017-01-12 (×4): 10 meq via INTRAVENOUS
  Filled 2017-01-12 (×4): qty 100

## 2017-01-12 MED ORDER — PROMETHAZINE HCL 25 MG/ML IJ SOLN
12.5000 mg | Freq: Four times a day (QID) | INTRAMUSCULAR | Status: DC | PRN
  Administered 2017-01-12: 12.5 mg via INTRAVENOUS
  Filled 2017-01-12: qty 1

## 2017-01-12 MED ORDER — MEASLES, MUMPS & RUBELLA VAC ~~LOC~~ INJ
0.5000 mL | INJECTION | Freq: Once | SUBCUTANEOUS | Status: AC
  Administered 2017-01-13: 0.5 mL via SUBCUTANEOUS
  Filled 2017-01-12: qty 0.5

## 2017-01-12 NOTE — Lactation Note (Signed)
This note was copied from a baby's chart. Lactation Consultation Note  Patient Name: [name] ZOXWR'UToday's Date: 01/12/2017   Attempted to see mom of 29 week infant at 6 hours of age. Infant with Hydrops Fetalis and Trisomy 21. Mom was in NICU visiting infant. NICU RN and mom's RN reports infant is in very critical condition. [facility] and she does not want to start pumping at this time. LC will follow up tomorrow.      Maternal Data    Feeding    LATCH Score/Interventions                      Lactation Tools Discussed/Used     Consult Status      Ed BlalockSharon S Jacy Howat 01/12/2017, 11:03 PM

## 2017-01-12 NOTE — Progress Notes (Signed)
Patient ID: Terry Richardson, female   DOB: 04-Oct-1990, 26 y.o.   MRN: [id number] Terry Richardson is a 26 y.o. G2P1001 at 5057w2d admitted for induction of labor due to pre-e w/ severe features. Fetus w/ T21, cardiac anomalies, A-V canal defect, and hydrops  Subjective: Uncomfortable w/ epidural, feeling pain of uc's as well as pressure, otherwise doing well. Denies ha, visual changes, ruq/epigastric pain, n/v.    Objective: BP 111/62   Pulse 89   Temp 97.9 F (36.6 C) (Oral)   Resp 16   Ht 5\' 4"  (1.626 m)   Wt 83.5 kg (184 lb)   LMP 07/12/2016 (Exact Date)   SpO2 95%   BMI 31.58 kg/m  Total I/O In: 1107.2 [I.V.:1107.2] Out: 675 [Urine:450; Emesis/NG output:225]  FHT:  FHR: 120 bpm, variability: min-mod,  accelerations:  Abscent,  decelerations:  Absent UC:   regular, every 2-4 minutes  SVE:   Dilation: 6 Effacement (%): 90 Station: Ballotable Exam by:: Terry Richardson BBOW, vtx by informal u/s  Pitocin @ 10 mu/min  Labs: Lab Results  Component Value Date   WBC 12.5 (H) 01/12/2017   HGB 8.9 (L) 01/12/2017   HCT 26.2 (L) 01/12/2017   MCV 90.0 01/12/2017   PLT 284 01/12/2017    Assessment / Plan: IOL d/t pre-e w/ severe features, fetus w/ T21, cardiac anomalies, and hydrops. Progressing well on pitocin, discussed AROM w/ Terry Richardson, vtx still very ballotable, will hold off for now and she is continuing to make change.  CBC/CMP q 4-6hrs  Labor: Progressing normally Fetal Wellbeing:  Category II Pain Control:  Epidural Pre-eclampsia: severe based on LFTs, on Mag @ 2gm/hr I/D:  GBS culture still pending, treating w/ PCN Anticipated MOD:  NSVD  Terry Richardson, Terry Richardson CNM, WHNP-BC 01/12/2017, 1100

## 2017-01-12 NOTE — Progress Notes (Signed)
Labor Progress Note Terry Richardson is a 26 y.o. G2P1001 at 6224w1d presented for Severe Pre-E by LFTs > 2xULN and found to have fetal hydrops fetalis.   S: Resting comfortably but awoke upon entering the room. Reporting some pressure but pain well-controlled with epidural.  O:  BP 140/68   Pulse 94   Temp 98.2 F (36.8 C) (Oral)   Resp 16   Ht 5\' 4"  (1.626 m)   Wt 184 lb (83.5 kg)   LMP 07/12/2016 (Exact Date)   SpO2 98%   BMI 31.58 kg/m  EFM: 120/min variability/10x10 accels  CVE: Dilation: 3 Presentation: Vertex Exam by: RN  A&P: 26 y.o. G2P1001 7124w1d admitted for severe Pre-E by LFT abnormalities and fetus with hydrops fetalis as well as NIPT positive for Trisomy 21 and suspected VSD.  #IOL: Negative CST.  IOL with FB followed by pitocin. FB remains in place. Classical and LTCS have been discussed with the patient should fetal intolerance or labor complications arise.  #Pre-E with SF by LFTs > 2xULN. On Mg gtt. Asymptomatic currently. Pressures normotensive. No signs of Mg toxicity. Adequate UOP.  - Continue Mg gtt for 24 hours PP - Trend CBC and CMP at least daily  #Pain: Eventual epidural.  #FWB: Hydrops fetalis, Trisomy 21, and VSD. S/p NICU and MFM consults. S/p BMZ x 2 (05/24-05/25). Recommend proceeding with IOL for maternal indications. Guarded fetal prognosis at this time. NICU will be present for delivery and necessary resuscitative measures.  #GBS: Pending. Will await culture results until FB out. If starting pit will start PCN ppx at least until culture results.   Tarri AbernethyAbigail J Imelda Dandridge, MD 3:58 AM

## 2017-01-12 NOTE — Progress Notes (Signed)
CRITICAL VALUE ALERT  Critical Value:  Potassium 2.7 Date & Time Notied:  01/12/2017 1120  Provider Notified: Genella RifeK. Booker CNM Orders Received/Actions taken: continue Kdur

## 2017-01-12 NOTE — Progress Notes (Signed)
Patient ID: Terry Richardson, female   DOB: Mar 02, 1991, 26 y.o.   MRN: [id number] Terry Richardson is a 26 y.o. G2P1001 at 169w2d admitted for induction of labor due to pre-e w/ severe features. Fetus w/ T21, cardiac anomalies, AV canal defect and hydrops.  Subjective: Feeling some pressure, urges to push  Objective: BP 120/82   Pulse 91   Temp 97.7 F (36.5 C) (Oral)   Resp 16   Ht 5\' 4"  (1.626 m)   Wt 83.5 kg (184 lb)   LMP 07/12/2016 (Exact Date)   SpO2 95%   BMI 31.58 kg/m  Total I/O In: 1387.2 [I.V.:1387.2] Out: 1845 [Urine:1620; Emesis/NG output:225]  FHT:  FHR: 125 bpm, variability: min-mod,  accelerations:  Present,  decelerations:  Present occ variable UC:   regular, every 2-3 minutes  SVE:   Dilation: 9 Effacement (%): 100 Station: 0 Exam by:: Shawna ClampKimberly Glynda Soliday   Pitocin @ 12 mu/min  Labs: Lab Results  Component Value Date   WBC 12.5 (H) 01/12/2017   HGB 8.9 (L) 01/12/2017   HCT 26.2 (L) 01/12/2017   MCV 90.0 01/12/2017   PLT 284 01/12/2017    Assessment / Plan: IOL d/t severe pre-e w/ multiple suspected fetal anomalies, progressing well  Labor: Progressing normally Fetal Wellbeing:  Category II Pain Control:  Epidural Pre-eclampsia: on mag, bp's stable I/D:  pcn for gbs + Anticipated MOD:  NSVD  Marge DuncansBooker, Vergil Burby Randall CNM, WHNP-BC 01/12/2017, 3:37 PM

## 2017-01-12 NOTE — Consult Note (Signed)
Neonatology Note:   Attendance at Delivery:    I was asked by Dr. Vergie LivingPickens to attend this vaginal delivery of a [redacted] week EGA infant with severe hydrops. The mother is a 26 y.o. G2P1001, GBS unknown with prenatal care.  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  ROM 3 hours before delivery, fluid clear. Accompanied by Dr. Cleatis PolkaAuten, and NICU team.  Infant not vigorous nor with good spontaneous cry and tone and is with notable hydrops.  Cord cut and brought to warmer.  HR >60 but <100.  Significant edema of skin/face/limbs/ascites.  Infant immediately intubated on first attempt at ~1 minute of life.  Placement confirmed by color change, auscultation and breathe sounds.  Sao2 and Ekg lead placed.  PPV through [place].  HR >100 and sats 40s.  ETT secured.  [place] pressures increased gradually without significant change in vitals.  See "Code Record" sheet for specific details of proceeding series of events.  Right and Left angiocath placed.  25cc serosanginous fluid moved from right and nil from left.  Angio cath also placed to LLQ and 15cc serosanginous fluid removed from abdominal cavity.  UVC placed for access; 20cc LR and surfactant also given.  Infant stabilized with HR [phone], reasonable pulses and Sao2 in mid 70s.  Apg 2/4/4. Mother updated.  Infant transferred to NICU for further management.     Dineen Kidavid C. Leary RocaEhrmann, MD

## 2017-01-12 NOTE — Anesthesia Postprocedure Evaluation (Signed)
Anesthesia Post Note  Patient: Terry Richardson  Procedure(s) Performed: * No procedures listed *  Patient location during evaluation: [facility] Anesthesia Type: Epidural Level of consciousness: awake and alert and oriented Pain management: pain level controlled Vital Signs Assessment: post-procedure vital signs reviewed and stable Respiratory status: spontaneous breathing and nonlabored ventilation Cardiovascular status: stable Postop Assessment: no headache, patient able to bend at knees, no backache, no signs of nausea or vomiting, epidural receding and adequate PO intake Anesthetic complications: no        Last Vitals:  Vitals:   01/12/17 1835 01/12/17 1940  BP: 136/73 134/80  Pulse: 92 99  Resp: 18 18  Temp: 36.8 C 36.9 C    Last Pain:  Vitals:   01/12/17 1940  TempSrc: Oral  PainSc:    Pain Goal: Patients Stated Pain Goal: 2 (01/11/17 2258)               Laban EmperorMalinova,Jamori Biggar Hristova

## 2017-01-12 NOTE — Anesthesia Preprocedure Evaluation (Signed)
Anesthesia Evaluation  Patient identified by MRN, date of birth, ID band Patient awake    Reviewed: Allergy & Precautions, Patient's Chart, lab work & pertinent test results  Airway Mallampati: II       Dental no notable dental hx.    Pulmonary neg pulmonary ROS,    Pulmonary exam normal        Cardiovascular hypertension, Normal cardiovascular exam     Neuro/Psych negative neurological ROS  negative psych ROS   GI/Hepatic negative GI ROS, Neg liver ROS,   Endo/Other    Renal/GU CRFRenal disease  negative genitourinary   Musculoskeletal negative musculoskeletal ROS (+)   Abdominal   Peds negative pediatric ROS (+)  Hematology negative hematology ROS (+)   Anesthesia Other Findings   Reproductive/Obstetrics (+) Pregnancy                             Lab Results  Component Value Date   WBC 12.8 (H) 01/12/2017   HGB 8.9 (L) 01/12/2017   HCT 26.2 (L) 01/12/2017   MCV 90.0 01/12/2017   PLT 284 01/12/2017     Anesthesia Physical Anesthesia Plan  ASA: II  Anesthesia Plan: Epidural   Post-op Pain Management:    Induction:   Airway Management Planned:   Additional Equipment:   Intra-op Plan:   Post-operative Plan:   Informed Consent: I have reviewed the patients History and Physical, chart, labs and discussed the procedure including the risks, benefits and alternatives for the proposed anesthesia with the patient or authorized representative who has indicated his/her understanding and acceptance.     Plan Discussed with:   Anesthesia Plan Comments:         Anesthesia Quick Evaluation

## 2017-01-12 NOTE — Anesthesia Procedure Notes (Signed)
Epidural Patient location during procedure: OB Start time: 01/12/2017 12:45 AM End time: 01/12/2017 12:51 AM  Staffing Anesthesiologist: Shona SimpsonHOLLIS, Taia Bramlett D Performed: anesthesiologist   Preanesthetic Checklist Completed: patient identified, site marked, surgical consent, pre-op evaluation, timeout performed, IV checked, risks and benefits discussed and monitors and equipment checked  Epidural Patient position: sitting Prep: ChloraPrep Patient monitoring: heart rate, continuous pulse ox and blood pressure Approach: midline Location: L3-L4 Injection technique: LOR saline  Needle:  Needle type: Tuohy  Needle gauge: 17 G Needle length: 9 cm Catheter type: closed end flexible Catheter size: 20 Guage Test dose: negative and 1.5% lidocaine  Assessment Events: blood not aspirated, injection not painful, no injection resistance and no paresthesia  Additional Notes LOR @ 4  Patient identified. Risks/Benefits/Options discussed with patient including but not limited to bleeding, infection, nerve damage, paralysis, failed block, incomplete pain control, headache, blood pressure changes, nausea, vomiting, reactions to medications, itching and postpartum back pain. Confirmed with bedside nurse the patient's most recent platelet count. Confirmed with patient that they are not currently taking any anticoagulation, have any bleeding history or any family history of bleeding disorders. Patient expressed understanding and wished to proceed. All questions were answered. Sterile technique was used throughout the entire procedure. Please see nursing notes for vital signs. Test dose was given through epidural catheter and negative prior to continuing to dose epidural or start infusion. Warning signs of high block given to the patient including shortness of breath, tingling/numbness in hands, complete motor block, or any concerning symptoms with instructions to call for help. Patient was given instructions on  fall risk and not to get out of bed. All questions and concerns addressed with instructions to call with any issues or inadequate analgesia.    Reason for block:procedure for pain

## 2017-01-12 NOTE — Progress Notes (Signed)
L&D Note 1308  Patient resting comfortably. No s/s of pre-x or Mg toxicity AF VS normal and stable (occasional mild range BPs) UOP >16700mL/hr Category I q3-2921m UCs NAD 8/80/ballotable/BBOW/cephalic AROM copious amounts of clear fluid 7-8/80/-2 for presenting part, BPD feels OOP  CBC Latest Ref Rng & Units 01/12/2017 01/12/2017 01/12/2017  WBC 4.0 - 10.5 K/uL 12.5(H) 11.3(H) 12.8(H)  Hemoglobin 12.0 - 15.0 g/dL 1.6(X8.9(L) 0.9(U8.2(L) 8.9(L)  Hematocrit 36.0 - 46.0 % 26.2(L) 23.7(L) 26.2(L)  Platelets 150 - 400 K/uL 284 241 284   CMP Latest Ref Rng & Units 01/12/2017 01/12/2017 01/11/2017  Glucose 65 - 99 mg/dL 045(W115(H) 098(J124(H) 191(Y132(H)  BUN 6 - 20 mg/dL <7(W<5(L) <2(N<5(L) <5(A<5(L)  Creatinine 0.44 - 1.00 mg/dL 2.130.52 0.860.51 5.780.51  Sodium 135 - 145 mmol/L 139 137 137  Potassium 3.5 - 5.1 mmol/L 2.7(LL) 2.9(L) 2.9(L)  Chloride 101 - 111 mmol/L 107 106 106  CO2 22 - 32 mmol/L 24 22 20(L)  Calcium 8.9 - 10.3 mg/dL 7.3(L) 7.1(L) 7.5(L)  Total Protein 6.5 - 8.1 g/dL 6.1(L) 5.2(L) 6.4(L)  Total Bilirubin 0.3 - 1.2 mg/dL 1.1 0.8 1.2  Alkaline Phos 38 - 126 U/L 96 82 96  AST 15 - 41 U/L 436(H) 390(H) 285(H)  ALT 14 - 54 U/L 475(H) 427(H) 327(H)     A/p: pt stable Continue with pitocin augmentation. Fetus tolerating labor well Continue Mg and serial labs. Will do IV K to MIVF in addition to PO K Two units PRBCs crossmatched NICU at delivery Anticipate SVD  Terry Richardson, Jr MD Attending Center for [facility](Faculty Practice)

## 2017-01-13 LAB — CBC WITH DIFFERENTIAL/PLATELET
BASOS ABS: 0 10*3/uL (ref 0.0–0.1)
BASOS PCT: 0 %
Basophils Absolute: 0 10*3/uL (ref 0.0–0.1)
Basophils Relative: 0 %
EOS ABS: 0 10*3/uL (ref 0.0–0.7)
Eosinophils Absolute: 0 10*3/uL (ref 0.0–0.7)
Eosinophils Relative: 0 %
Eosinophils Relative: 0 %
HCT: 26.1 % — ABNORMAL LOW (ref 36.0–46.0)
HEMATOCRIT: 22.4 % — AB (ref 36.0–46.0)
HEMOGLOBIN: 7.7 g/dL — AB (ref 12.0–15.0)
Hemoglobin: 8.9 g/dL — ABNORMAL LOW (ref 12.0–15.0)
LYMPHS ABS: 2.9 10*3/uL (ref 0.7–4.0)
LYMPHS PCT: 20 %
Lymphocytes Relative: 21 %
Lymphs Abs: 3.3 10*3/uL (ref 0.7–4.0)
MCH: 30.7 pg (ref 26.0–34.0)
MCH: 30.6 pg (ref 26.0–34.0)
MCHC: 34.4 g/dL (ref 30.0–36.0)
MCHC: 34.1 g/dL (ref 30.0–36.0)
MCV: 89.2 fL (ref 78.0–100.0)
MCV: 89.7 fL (ref 78.0–100.0)
MONO ABS: 1.2 10*3/uL — AB (ref 0.1–1.0)
MONOS PCT: 7 %
MONOS PCT: 8 %
Monocytes Absolute: 1 10*3/uL (ref 0.1–1.0)
NEUTROS ABS: 10.5 10*3/uL — AB (ref 1.7–7.7)
NEUTROS ABS: 11.4 10*3/uL — AB (ref 1.7–7.7)
NEUTROS PCT: 73 %
NEUTROS PCT: 71 %
Platelets: 232 10*3/uL (ref 150–400)
Platelets: 279 10*3/uL (ref 150–400)
RBC: 2.51 MIL/uL — AB (ref 3.87–5.11)
RBC: 2.91 MIL/uL — ABNORMAL LOW (ref 3.87–5.11)
RDW: 13.9 % (ref 11.5–15.5)
RDW: 13.9 % (ref 11.5–15.5)
WBC: 14.4 10*3/uL — AB (ref 4.0–10.5)
WBC: 15.9 10*3/uL — ABNORMAL HIGH (ref 4.0–10.5)

## 2017-01-13 LAB — COMPREHENSIVE METABOLIC PANEL
ALBUMIN: 2.3 g/dL — AB (ref 3.5–5.0)
ALK PHOS: 85 U/L (ref 38–126)
ALK PHOS: 96 U/L (ref 38–126)
ALT: 429 U/L — AB (ref 14–54)
ALT: 330 U/L — ABNORMAL HIGH (ref 14–54)
ALT: 350 U/L — AB (ref 14–54)
ANION GAP: 8 (ref 5–15)
ANION GAP: 7 (ref 5–15)
AST: 369 U/L — ABNORMAL HIGH (ref 15–41)
AST: 244 U/L — AB (ref 15–41)
AST: 218 U/L — AB (ref 15–41)
Albumin: 2 g/dL — ABNORMAL LOW (ref 3.5–5.0)
Albumin: 2.3 g/dL — ABNORMAL LOW (ref 3.5–5.0)
Alkaline Phosphatase: 77 U/L (ref 38–126)
Anion gap: 6 (ref 5–15)
BILIRUBIN TOTAL: 0.8 mg/dL (ref 0.3–1.2)
BILIRUBIN TOTAL: 0.6 mg/dL (ref 0.3–1.2)
BUN: <5 mg/dL — ABNORMAL LOW (ref 6–20)
CALCIUM: 7.5 mg/dL — AB (ref 8.9–10.3)
CALCIUM: 6.9 mg/dL — AB (ref 8.9–10.3)
CHLORIDE: 106 mmol/L (ref 101–111)
CHLORIDE: 106 mmol/L (ref 101–111)
CO2: 23 mmol/L (ref 22–32)
CO2: 24 mmol/L (ref 22–32)
CO2: 26 mmol/L (ref 22–32)
CREATININE: 0.55 mg/dL (ref 0.44–1.00)
CREATININE: 0.53 mg/dL (ref 0.44–1.00)
Calcium: 6.7 mg/dL — ABNORMAL LOW (ref 8.9–10.3)
Chloride: 107 mmol/L (ref 101–111)
Creatinine, Ser: 0.64 mg/dL (ref 0.44–1.00)
GFR calc Af Amer: >60 mL/min (ref >60)
GFR calc Af Amer: >60 mL/min (ref >60)
GFR calc non Af Amer: >60 mL/min (ref >60)
GFR calc non Af Amer: >60 mL/min (ref >60)
GLUCOSE: 128 mg/dL — AB (ref 65–99)
GLUCOSE: 89 mg/dL (ref 65–99)
Glucose, Bld: 106 mg/dL — ABNORMAL HIGH (ref 65–99)
POTASSIUM: 3 mmol/L — AB (ref 3.5–5.1)
Potassium: 3 mmol/L — ABNORMAL LOW (ref 3.5–5.1)
Potassium: 3.7 mmol/L (ref 3.5–5.1)
SODIUM: 138 mmol/L (ref 135–145)
Sodium: 138 mmol/L (ref 135–145)
Sodium: 137 mmol/L (ref 135–145)
TOTAL PROTEIN: 5 g/dL — AB (ref 6.5–8.1)
TOTAL PROTEIN: 4.8 g/dL — AB (ref 6.5–8.1)
Total Bilirubin: 0.5 mg/dL (ref 0.3–1.2)
Total Protein: 5.4 g/dL — ABNORMAL LOW (ref 6.5–8.1)

## 2017-01-13 LAB — CULTURE, BETA STREP (GROUP B ONLY)

## 2017-01-13 MED ORDER — POTASSIUM CHLORIDE CRYS ER 20 MEQ PO TBCR
40.0000 meq | EXTENDED_RELEASE_TABLET | Freq: Three times a day (TID) | ORAL | Status: DC
  Administered 2017-01-13 (×3): 40 meq via ORAL
  Filled 2017-01-13 (×5): qty 2

## 2017-01-13 NOTE — Progress Notes (Signed)
Pt requested Chaplain in order to ask questions regarding final arrangements. She said they will cremate remains. I reviewed list w/pt and encouraged her to call around in order to make a final decision.  Please page if additional support is needed. Chaplain Marjory LiesPamela Carrington Holder, M.Div.   01/13/17 1000  Clinical Encounter Type  Visited With Patient and family together

## 2017-01-13 NOTE — Progress Notes (Signed)
Pt provided with funeral home list, cremation form, and birth registration (green form). Pt had questions regarding magnesium and length of stay.  Questions answered and pt states understanding. Family at bedside, pt states no further needs at this time

## 2017-01-13 NOTE — Progress Notes (Signed)
Discharge instructions given, questions answered, pt states understanding, signed and given copy 

## 2017-01-13 NOTE — Discharge Summary (Signed)
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 [O35.8XX0] Supervision of high risk pregnancy, antepartum [O09.90] Intractable vomiting with nausea, unspecified vomiting type [R11.2] Intrauterine pregnancy: 3887w2d     Secondary diagnosis:  Principal Problem:   HELLP (hemolytic anemia/elev liver enzymes/low platelets in pregnancy), third trimester Active Problems:   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 yo G2P1102 at 7087w2d was admitted to the hospital 01/10/2017 for elevated LFTs with mildly elevated BPs and elevated Pr:cr . Pt had an US which revealed hydrops. Pt reported feeling 'sick' for a few weeks. After consultation with MF and neonatology the pt was induced. Indication for induction: Preeclampsia and fetal anomalies including hydrops.  .  Patient had an uncomplicated labor course as follows: Membrane Rupture Time/Date: 12:56 PM ,01/12/2017   Intrapartum Procedures: Episiotomy: None [1]                                         Lacerations:  None [1]  Patient had delivery of a Viable infant.  Information for the patient's newborn:  Madelaine EtienneFunk, Girl Allexis [[id number]][[id number]]  Delivery  Method: Vaginal, Spontaneous Delivery (Filed from Delivery Summary)  The infant died soon after delivery.  Today the pt reports feeling better physicially but, emotionally sad. She is requesting discharge later today.   01/12/2017  Details of delivery can be found in separate delivery note.  Patient had a routine postpartum course. Patient is discharged home 01/13/17.  Physical exam  Vitals:   01/13/17 0530 01/13/17 0630 01/13/17 0752 01/13/17 1120  BP:   (!) 105/56 128/76  Pulse:   82 81  Resp: 17 18 18 16   Temp:   97.7 F (36.5 C) 98.2 F (36.8 C)  TempSrc:   Oral Oral  SpO2:   96% 99%  Weight:      Height:       General: alert, cooperative and no distress Lochia: appropriate Uterine Fundus: firm Incision: N/A DVT Evaluation: No evidence of DVT seen on physical exam. Labs: Lab Results  Component Value Date   WBC 15.9 (H) 01/13/2017   HGB 8.9 (L) 01/13/2017   HCT 26.1 (L) 01/13/2017   MCV 89.7 01/13/2017   PLT 279 01/13/2017   CMP  Latest Ref Rng & Units Feb 10, 2017  Glucose 65 - 99 mg/dL 89  BUN 6 - 20 mg/dL <1(O)  Creatinine 1.09 - 1.00 mg/dL 6.04  Sodium 540 - 981 mmol/L 138  Potassium 3.5 - 5.1 mmol/L 3.7  Chloride 101 - 111 mmol/L 106  CO2 22 - 32 mmol/L 26  Calcium 8.9 - 10.3 mg/dL 6.9(L)  Total Protein 6.5 - 8.1 g/dL 1.9(J)  Total Bilirubin 0.3 - 1.2 mg/dL 0.5  Alkaline Phos 38 - 126 U/L 96  AST 15 - 41 U/L 218(H)  ALT 14 - 54 U/L 350(H)    Discharge instruction: per After Visit Summary and "Baby and Me Booklet".  After visit meds:  Allergies as of 2017/02/10   No Known Allergies     Medication List    STOP taking these medications   acetaminophen 500 MG tablet Commonly known as:  TYLENOL     TAKE these medications   multivitamin-prenatal 27-0.8 MG Tabs tablet Take 1 tablet by mouth daily at 12 noon.       Diet: low salt diet  Activity: Advance as tolerated. Pelvic rest for 6 weeks.   Outpatient follow YN:WGNF  Follow up Appt:Future  Appointments Date Time Provider Department Center  01/25/2017 10:30 AM [facility]-MFC [place] 5 WH-MFCUS MFC-US  01/28/2017 1:00 PM Adam Phenix, MD [facility]   Follow up Visit:No Follow-up on file.  Postpartum contraception: IUD Mirena  Newborn Data: Live born female  Birth Weight:   APGAR: 2, 4  Baby Feeding: deceased Disposition:morgue   02/10/17 Willodean Rosenthal, MD

## 2017-01-13 NOTE — Progress Notes (Signed)
Pt was sitting up in bed when I arrived. She was holding her baby. After a few moments of providing emotional and support by being present, pt said she wanted just a few minutes alone with Eloni.  I returned after about 10 minutes. She had spoken of allowing staff to bath her baby during our first visit, and said she needed another 30 minutes. Pt had not called her SO (baby's father) or any family during the initial part of our visit. During the latter part of our visit she said she had called and left him a message to call her.  She said her mother is in [place] but she is not the person she would call. She said her mother did not raise her. She said she has other support whom she would call later this morning. During my visit, NICU checked with her and at that time she gave her baby to them to bathe. She said she did not want her back because it is too much. We assured her if she changes her mind to let staff know. She confirmed that her SO may see their baby if he desires. Pt wanted funeral home information, which I presented her. Pt also wanted prayer. She said she had been praying over her baby. Pt was very, but appropriately, tearful. She said she was trying to take in the reality. Pt was very appreciative of visit.  Please page if support is needed prior to next visit. Chaplain Marjory LiesPamela Carrington Holder, M.Div.   01/13/17 0400  Clinical Encounter Type  Visited With Patient

## 2017-01-13 NOTE — Discharge Instructions (Signed)
Vaginal Delivery, Care After °Refer to this sheet in the next few weeks. These instructions provide you with information on caring for yourself after your delivery. Your health care provider may also give you more specific instructions. Your treatment has been planned according to current medical practices, but problems sometimes occur. Call your health care provider if you have any problems or questions after your delivery. °What to expect after your delivery °After your delivery, it is typical to have the following: °· You may feel pain in the vaginal area for several days after delivery. If you had an incision or a vaginal tear, the area will probably continue to be tender to the touch for several weeks. °· You may feel very fatigued after a vaginal delivery. °· You may have vaginal bleeding and discharge that will start out red, then become pink, then yellow, then white. Altogether, this usually lasts for about 6 weeks. °· The combination of having lost your baby and changing hormones from the delivery can make you feel very sad. You may also experience emotions that change very quickly. Some of the emotions people often notice after loss include: °¨ Anger. °¨ Denial. °¨ Guilt. °¨ Sorrow. °¨ Depression. °¨ Grief. °¨ Relationship problems. °Follow these instructions at home: °· Consider seeking support for your loss. Some forms of support that you might consider include your religious leader, friends, family, a professional counselor, or a bereavement support group. °· Take medicines only as directed by your health care provider. °· Continue to use good perineal care. Good perineal care includes: °¨ Wiping your perineum from front to back. °¨ Keeping your perineum clean. °· Do not use tampons or douche until your health care provider says it is okay. °· Shower, wash your hair, and take tub baths as directed by your health care provider. °· Wear a well-fitting bra that provides breast support. °· Drink enough  fluids to keep your urine clear or pale yellow. °· Eat healthy foods. °· Eat high-fiber foods every day, such as whole grain cereals and breads, brown rice, beans, and fresh fruits and vegetables. These foods may help prevent or relieve constipation. °· Follow your health care provider's directions about resuming activities such as climbing stairs, driving, lifting, exercising, or traveling. °· Increase your activities gradually. °· Talk to your health care provider about resuming sexual activities. This depends on your risk of infection, your rate of healing, and your comfort and desire to resume sexual activity. °· Try to have someone help you with your household activities for at least a few days after you leave the hospital. °· Rest as much as possible. °· Keep all of your scheduled postpartum appointments. It is very important to keep your scheduled follow-up appointments. At these appointments, your health care provider will be checking to make sure that you are healing physically and emotionally. °· Do not drink alcohol, especially if you are taking medicine to relieve pain. °· Do not use any tobacco products including cigarettes, chewing tobacco, or electronic cigarettes. If you need help quitting, ask your health care provider. °· Do not use illegal drugs. °Contact a health care provider if: °· You feel sad or depressed. °· You have thoughts of hurting yourself. °· You are having trouble eating or sleeping. °· You cannot enjoy the things in life you have previously enjoyed. °· You are passing large clots from your vagina. Save any clots to show your health care provider. °· You have a bad smelling discharge from your vagina. °·   You have trouble urinating. °· You are urinating frequently. °· You have pain when you urinate. °· You have a change in your bowel movements. °· You have increasing redness, pain, or swelling near your incision or vaginal tear. °· You have pus draining from your incision or vaginal  tear. °· Your incision or vaginal tear is separating. °· You have painful, hard, or reddened breasts. °· You have a severe headache. °· You have blurred vision or see spots. °· You are dizzy or light-headed. °· You have a rash. °· You have nausea or vomiting. °· You have not had a menstrual period by the 12th week after delivery. °· You have a fever. °Get help right away if: °· You are concerned that you may hurt yourself or you are considering suicide. °· You have persistent pain. °· You have chest pain. °· You have shortness of breath. °· You faint. °· You have leg pain. °· You have stomach pain. °· Your vaginal bleeding saturates two or more sanitary pads in 1 hour. °This information is not intended to replace advice given to you by your health care provider. Make sure you discuss any questions you have with your health care provider. °Document Released: 12/21/2013 Document Revised: 01/12/2016 Document Reviewed: 09/24/2013 °Elsevier Interactive Patient Education © 2017 [employer]vier Inc. ° °

## 2017-01-13 NOTE — Progress Notes (Signed)
Terry Richardson was awake and sitting up in bed when I arrived. Her best friend Terry Richardson was bedside. She said she is coping better now and she appeared more present. She had made calls to the funeral homes and selected [name]. She said she will meet them in the morning. Pt did not have any further questions. She said her SO is having a hard time. She was very appreciative of visits and support. Please page if additional support is needed. Chaplain [name], MDiv   01/13/17 1600  Clinical Encounter Type  Visited With Patient and family together

## 2017-01-13 NOTE — Lactation Note (Signed)
Lactation Consultation Note  Patient Name: Lelon Frohlichisha Mourer JYNWG'NToday's Date: 01/13/2017   Initial consult with mom who suffered a fetal demise of 29 week infant. This is mom's 2nd baby.   Discussed with mom that milk will still come in despite fetal loss. Lactation after Loss brochure given. Enc mom to use ice pack and cabbage leaves as wanted to try and dry up milk. Discussed using hand expression to relieve pressure as needed, hand expression handout given and explained. Enc mom to wear supportive bra.   [facility] given for mom to call with questions/concerns.   Mom has emergency Medicaid here in [place]. [facility], she qualifies for Medicaid. G[facility] mom [facility]. [facility], [facility]. Enc mom to call if she [facility] not [facility].   Mom without further questions/concerns at this time. Enc mom to call [facility] with any questions/concerns.        Maternal Data    Feeding    LATCH Score/Interventions                      Lactation Tools Discussed/Used     Consult Status      Ed BlalockSharon S Titianna Loomis 01/13/2017, 2:21 PM

## 2017-01-14 LAB — TYPE AND SCREEN
ABO/RH(D): O POS
Antibody Screen: NEGATIVE
Unit division: 0
Unit division: 0

## 2017-01-14 LAB — BPAM RBC
BLOOD PRODUCT EXPIRATION DATE: 201806142359
Blood Product Expiration Date: 201806142359
UNIT TYPE AND RH: 5100
UNIT TYPE AND RH: 5100

## 2017-01-16 ENCOUNTER — Ambulatory Visit (INDEPENDENT_AMBULATORY_CARE_PROVIDER_SITE_OTHER): Payer: Medicaid Other | Admitting: Clinical

## 2017-01-16 ENCOUNTER — Ambulatory Visit (INDEPENDENT_AMBULATORY_CARE_PROVIDER_SITE_OTHER): Payer: Medicaid Other | Admitting: Obstetrics & Gynecology

## 2017-01-16 ENCOUNTER — Encounter: Payer: Self-pay | Admitting: Obstetrics & Gynecology

## 2017-01-16 VITALS — BP 153/97 | HR 89 | Wt 186.8 lb

## 2017-01-16 DIAGNOSIS — F4321 Adjustment disorder with depressed mood: Secondary | ICD-10-CM

## 2017-01-16 DIAGNOSIS — O1415 Severe pre-eclampsia, complicating the puerperium: Secondary | ICD-10-CM

## 2017-01-16 MED ORDER — AMLODIPINE BESYLATE 5 MG PO TABS: 5.0000 mg | ORAL_TABLET | Freq: Every day | ORAL | 1 refills | Status: AC

## 2017-01-16 NOTE — BH Specialist Note (Signed)
Integrated Behavioral Health Initial Visit  MRN: [id number] Name: Terry Richardson   Session Start time: 10:22 Session End time: 10:49 Total time: 30 minutes  Type of Service: Integrated Behavioral Health- Individual/Family Interpretor:No. Interpretor Name and Language: n/a   Warm Hand Off Completed.       SUBJECTIVE: Terry Richardson is a 26 y.o. female accompanied by patient and friend. Patient was referred by Dr Erin FullingHarraway-Smith for grief. Patient reports the following symptoms/concerns: Pt primary symptom is grieving fetal loss at 28wks. Duration of problem: 4 days; Severity of problem: severe  OBJECTIVE: Mood: Depressed and Affect: Depressed and Tearful Risk of harm to self or others: No plan to harm self or others   LIFE CONTEXT: Family and Social: Lives with 2yo; best friend staying temporarily to help  School/Work: - Self-Care: - Life Changes: Fetal loss at 28wks  GOALS ADDRESSED:  Begin healthy grieving over loss   INTERVENTIONS: Supportive Counseling and Link to [facility]  Standardized Assessments completed: Patient declined screening  ASSESSMENT: Patient currently experiencing Grief . Patient may benefit from supportive counseling today, and link to community resources for future emotional support.  PLAN: 1. Follow up with behavioral health clinician on : As needed 2. Behavioral recommendations:  -Continue to allow friends and family to give emotional support -Consider [facility] grief support, as needed 3. Referral(s): Integrated [facility] ([facility] Clinic) and Community Resources:  [facility] support  Rae LipsJamie C Cayne Yom, [place]  Depression screen [facility] 2/9 01/09/2017  Decreased Interest 1  Down, Depressed, Hopeless 1  PHQ - 2 Score 2  Altered sleeping 3  Tired, decreased energy 3  Change in appetite 3  Feeling bad or failure about yourself  0  Trouble concentrating 0  Moving slowly or fidgety/restless 0  Suicidal thoughts 0  PHQ-9  Score 11   GAD 7 : Generalized Anxiety Score 01/09/2017  Nervous, Anxious, on Edge 2  Control/stop worrying 2  Worry too much - different things 2  Trouble relaxing 2  Restless 0  Easily annoyed or irritable 2  Afraid - awful might happen 0  Total GAD 7 Score 10

## 2017-01-16 NOTE — Patient Instructions (Signed)

## 2017-01-16 NOTE — Progress Notes (Deleted)
Subjective:     Terry Richardson is a 26 y.o. female who presents for a postpartum visit. She is 4 days postpartum following a spontaneous vaginal delivery. I have fully reviewed the prenatal and intrapartum course. The delivery was at 29 gestational weeks. Outcome: spontaneous vaginal delivery. Anesthesia: epidural. Postpartum course has been ***. Baby's course has been n/a, deceased. Baby is feeding by n/a. Bleeding {vag bleed:12292}. Bowel function is {normal:32111}. Bladder function is {normal:32111}. Patient {is/is not:9024} sexually active. Contraception method is {contraceptive method:5051}. Postpartum depression screening: {neg default:13464::"negative"}.  {Common ambulatory SmartLinks:19316}  Review of Systems {ros; complete:30496}   Objective:    LMP 07/12/2016 (Exact Date)   General:  {gen appearance:16600}   Breasts:  {breast exam:1202::"inspection negative, no nipple discharge or bleeding, no masses or nodularity palpable"}  Lungs: {lung exam:16931}  Heart:  {heart exam:5510}  Abdomen: {abdomen exam:16834}   Vulva:  {labia exam:12198}  Vagina: {vagina exam:12200}  Cervix:  {cervix exam:14595}  Corpus: {uterus exam:12215}  Adnexa:  {adnexa exam:12223}  Rectal Exam: {rectal/vaginal exam:12274}        Assessment:    *** postpartum exam. Pap smear {done:10129} at today's visit.   Plan:    1. Contraception: {method:5051} 2. *** 3. Follow up in: {1-10:13787} {time; units:19136} or as needed.

## 2017-01-16 NOTE — Progress Notes (Addendum)
History:  26 y.o. [name] here today for eval of BP and mood after delivery due to HELLP with result of an SVD with a subsequent perinatal loss due to preeclampsia. Pt declined to complete the PHQ9 or GAD7 because she said its 'too fresh'.  She reports that she feels that she is 'almost there'.  When asked what tha means she said that she can finially 'function without feeling like I'm goin got have an anxiety attack.'   She has been cooking a lot which helps her to relax. She reports some pain in her back after the epidural. She denies suicidal or homicidal ideation.  She reports a mild HA that she has not had to take any meds for. NO RUQ pain or SOB.   The following portions of the patient's history were reviewed and updated as appropriate: allergies, current medications, past family history, past medical history, past social history, past surgical history and problem list.  Review of Systems:  Pertinent items are noted in HPI.   Objective:  Physical Exam Blood pressure (!) 153/97, pulse 89, weight 186 lb 12.8 oz (84.7 kg), last menstrual period 07/12/2016, unknown if currently breastfeeding. Gen: NAD; teary with discussion Lungs: CTA CV: RRR Abd: Soft, nontender and nondistended Pelvic: deferred  Assessment & Plan:  4 day PP check. Pt is 4 days s/p SVD with h/o preeclampsia with severe features/HELLP and a perinatal demise of a fetus with hydrops.  BP elevated today.  Normal grief reaction.   CMP and CBC today Begin Norvasc 5 mg po q day F/u in 1 weeks or sooner prn IUD after 4-6 weeks Referral to behavioral health for consultation today  Addendum: pt declines talking ot Terry Richardson. Wants to f/u in 1 week   Terry Richardson, M.D., Evern CoreFACOG

## 2017-01-16 NOTE — Progress Notes (Signed)
Patient declines to fill out PHQ-9 and GAD-7, she is tearful and depressed appearing. States no desire for self harm, does agree to see Asher MuirJamie. Patient and/or legal guardian verbally consented to meet with Behavioral Health Clinician about presenting concerns.

## 2017-01-17 LAB — COMPREHENSIVE METABOLIC PANEL
A/G RATIO: 1.4 (ref 1.2–2.2)
ALK PHOS: 94 IU/L (ref 39–117)
ALT: 125 IU/L — ABNORMAL HIGH (ref 0–32)
AST: 42 IU/L — AB (ref 0–40)
Albumin: 2.9 g/dL — ABNORMAL LOW (ref 3.5–5.5)
BUN/Creatinine Ratio: 15 (ref 9–23)
BUN: 8 mg/dL (ref 6–20)
Bilirubin Total: 0.2 mg/dL (ref 0.0–1.2)
CO2: 24 mmol/L (ref 18–29)
Calcium: 8.4 mg/dL — ABNORMAL LOW (ref 8.7–10.2)
Chloride: 104 mmol/L (ref 96–106)
Creatinine, Ser: 0.53 mg/dL — ABNORMAL LOW (ref 0.57–1.00)
GFR calc Af Amer: 154 mL/min/{1.73_m2} (ref >59)
GFR calc non Af Amer: 133 mL/min/{1.73_m2} (ref >59)
GLOBULIN, TOTAL: 2.1 g/dL (ref 1.5–4.5)
Glucose: 70 mg/dL (ref 65–99)
POTASSIUM: 4.4 mmol/L (ref 3.5–5.2)
SODIUM: 139 mmol/L (ref 134–144)
Total Protein: 5 g/dL — ABNORMAL LOW (ref 6.0–8.5)

## 2017-01-17 LAB — CBC
Hematocrit: 27.1 % — ABNORMAL LOW (ref 34.0–46.6)
Hemoglobin: 9.1 g/dL — ABNORMAL LOW (ref 11.1–15.9)
MCH: 29.7 pg (ref 26.6–33.0)
MCHC: 33.6 g/dL (ref 31.5–35.7)
MCV: 89 fL (ref 79–97)
PLATELETS: 352 10*3/uL (ref 150–379)
RBC: 3.06 x10E6/uL — AB (ref 3.77–5.28)
RDW: 14.5 % (ref 12.3–15.4)
WBC: 12.4 10*3/uL — AB (ref 3.4–10.8)

## 2017-01-18 ENCOUNTER — Encounter (HOSPITAL_COMMUNITY): Payer: Self-pay | Admitting: *Deleted

## 2017-01-21 ENCOUNTER — Ambulatory Visit: Payer: Self-pay | Admitting: Family Medicine

## 2017-01-22 ENCOUNTER — Encounter: Payer: Self-pay | Admitting: General Practice

## 2017-01-23 ENCOUNTER — Telehealth: Payer: Self-pay | Admitting: Clinical

## 2017-01-23 NOTE — Telephone Encounter (Signed)
Attempt to f/u mood check with pt, left HIPPA-compliant message to call back Asher [facility]Jamie [facility] at [phone].

## 2017-01-25 ENCOUNTER — Ambulatory Visit (HOSPITAL_COMMUNITY): Payer: Self-pay

## 2017-01-28 ENCOUNTER — Encounter: Payer: Self-pay | Admitting: Obstetrics & Gynecology

## 2018-07-01 IMAGING — US US ABDOMEN LIMITED
1 series · 14 of 25 positions shown · non-contrast
Comparison: None.

CLINICAL DATA: Chronic kidney disease.  Right upper quadrant pain.

EXAM:
US ABDOMEN LIMITED - RIGHT UPPER QUADRANT

[Series 1: us abdomen limited · 0.22mm/px · 14 of 40 slices shown]
[im 1/40]
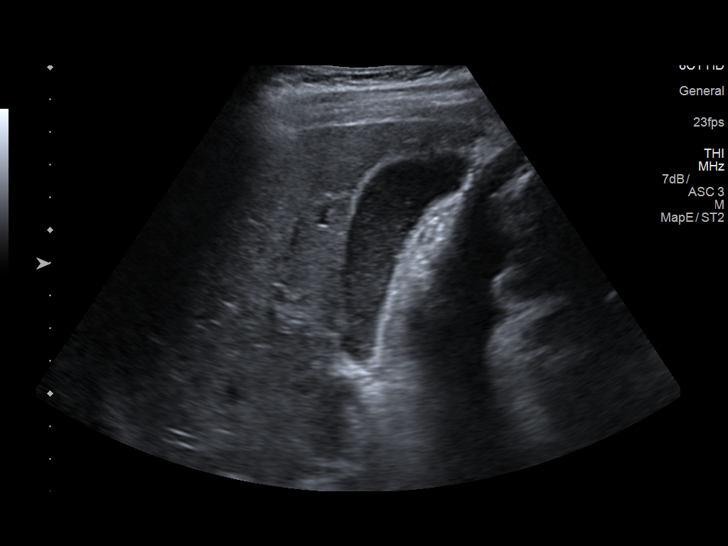
[im 4/40]
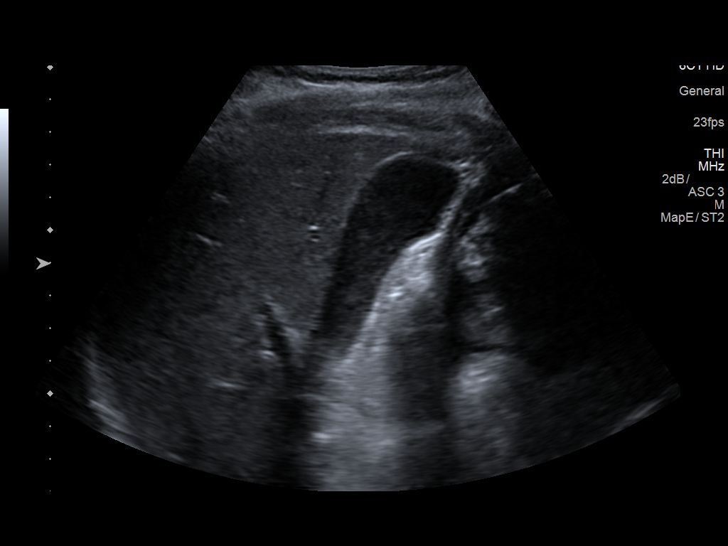
[im 7/40]
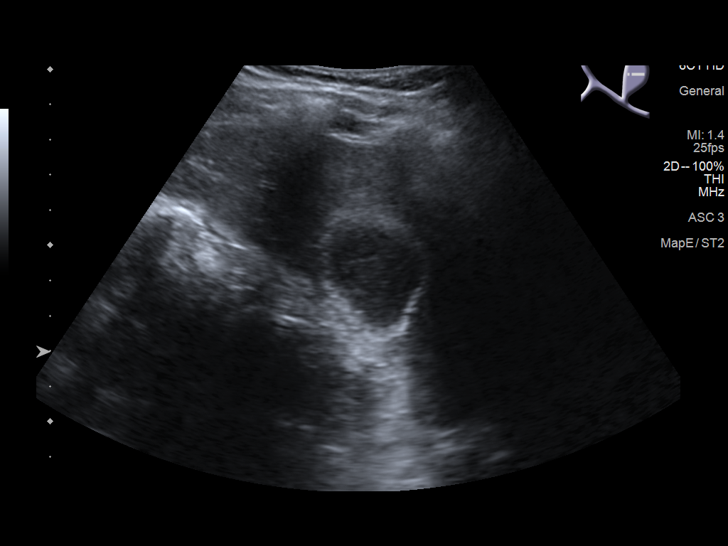
[im 10/40]
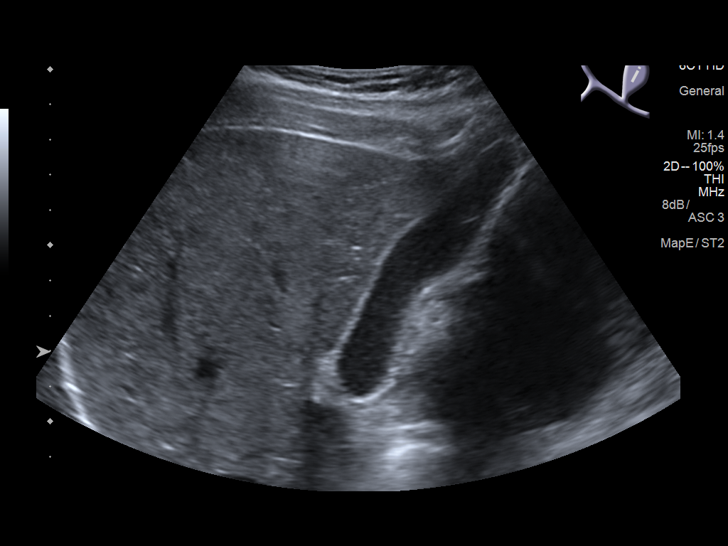
[im 14/40]
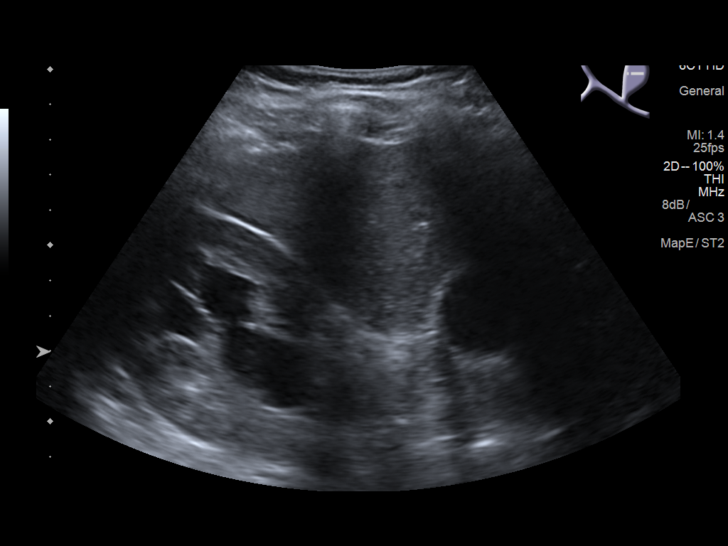
[im 15/40]
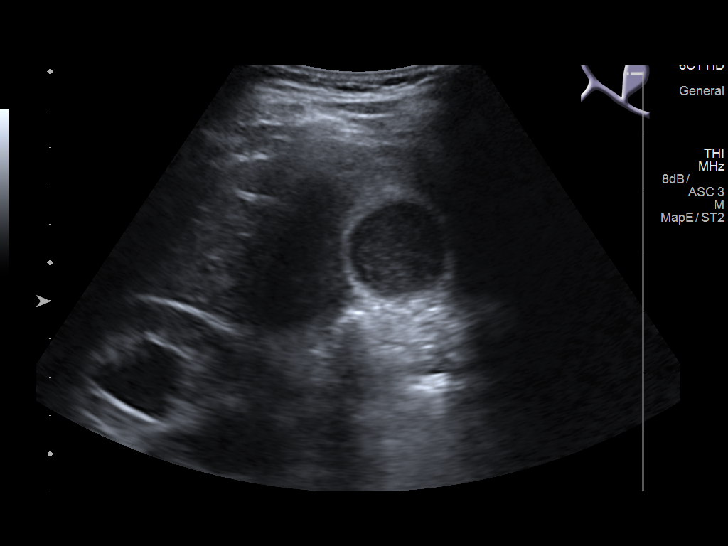
[im 18/40]
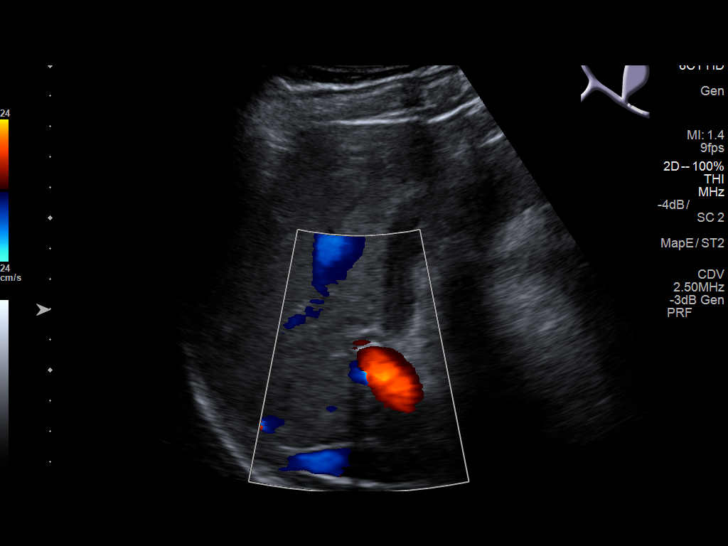
[im 22/40]
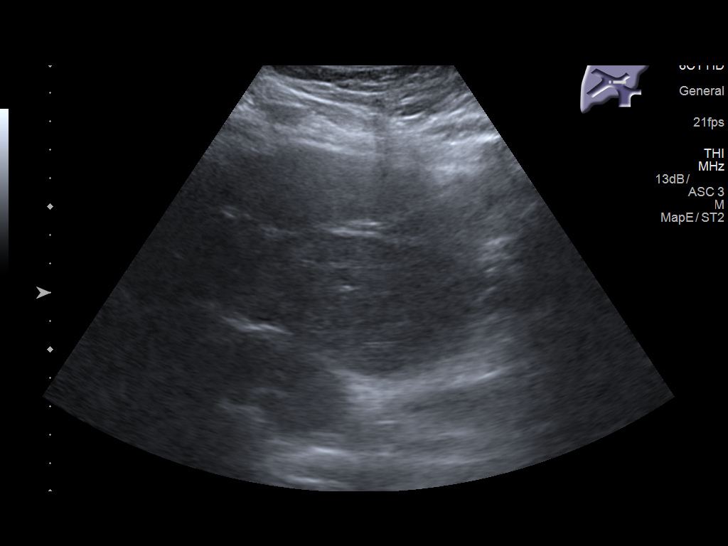
[im 25/40]
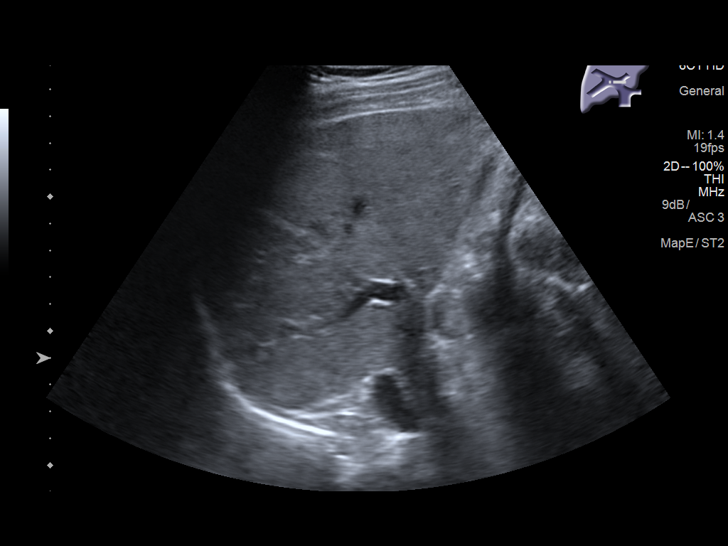
[im 27/40]
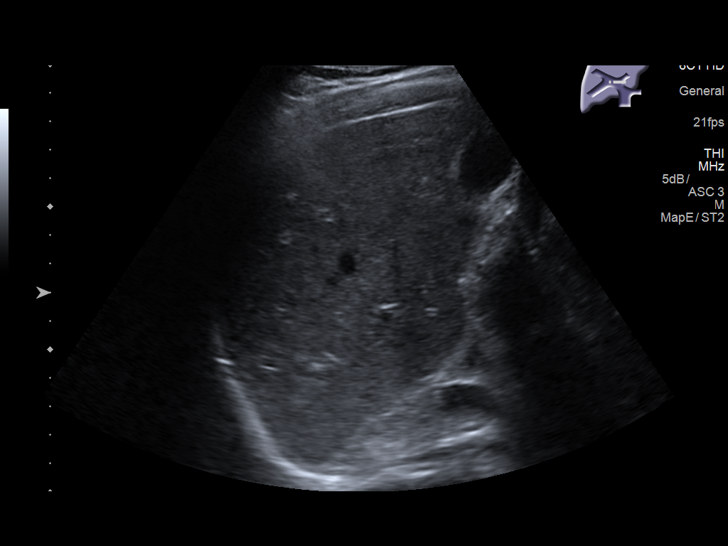
[im 30/40]
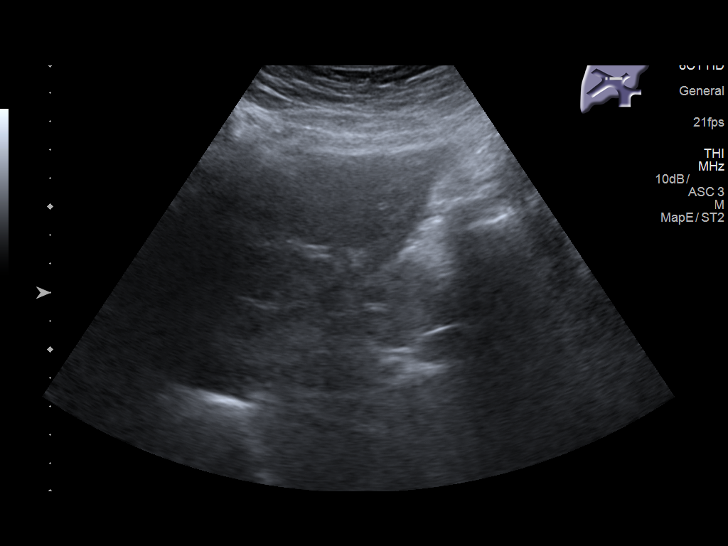
[im 33/40]
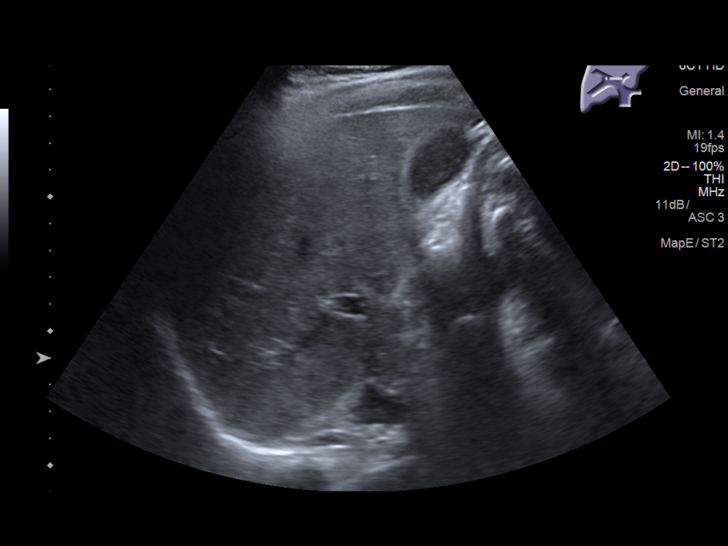
[im 36/40]
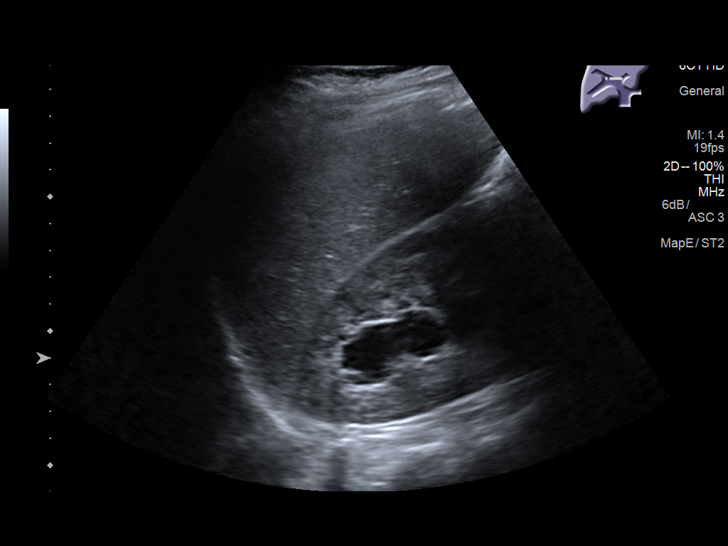
[im 40/40]
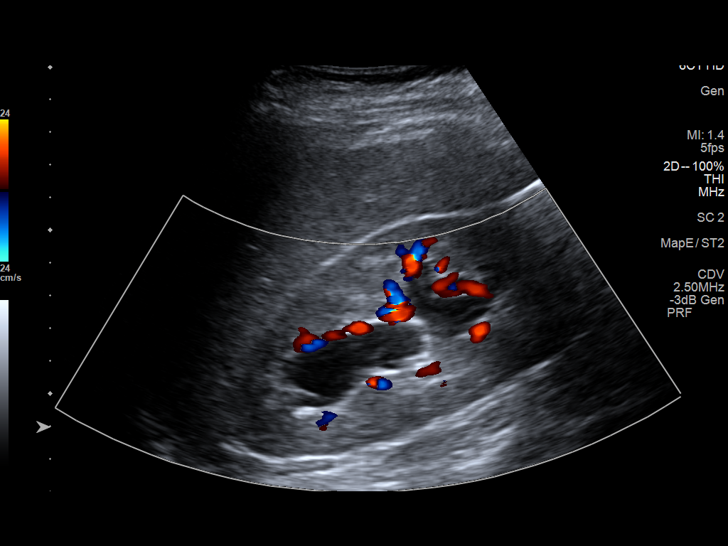

[14 of 25 positions shown; findings below may reference images not displayed]

FINDINGS: Gallbladder:

No gallstones or wall thickening visualized. Small amount of
gallbladder sludge. No sonographic Murphy sign noted by sonographer.

Common bile duct:

Diameter: 3 mm

Liver:

No focal lesion identified. Within normal limits in parenchymal
echogenicity.

Other:  Severe right hydronephrosis partially visualize.
IMPRESSION: 1. No cholelithiasis or sonographic evidence of acute cholecystitis.
2. Severe right hydronephrosis of uncertain etiology.

## 2018-12-18 ENCOUNTER — Encounter: Payer: Self-pay | Admitting: *Deleted

## 2018-12-31 IMAGING — US US MFM FETAL BPP W/O NON-STRESS
1 series · 14 of 28 positions shown · non-contrast
Comparison: none

[Series 1: us mfm fetal bpp w/o non-stress · 72 acquisitions, 14 frames shown]
[im 3/72]
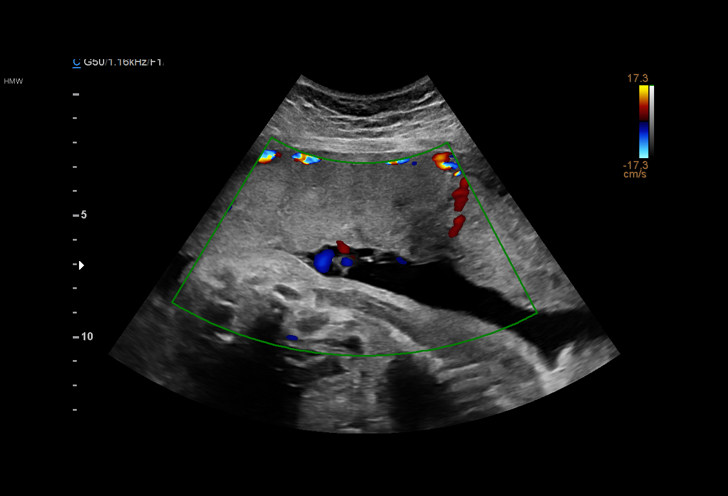
[im 8/72]
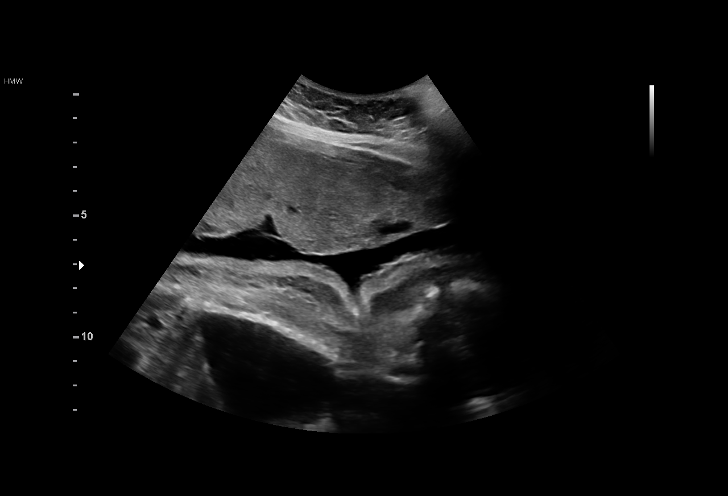
[im 14/72]
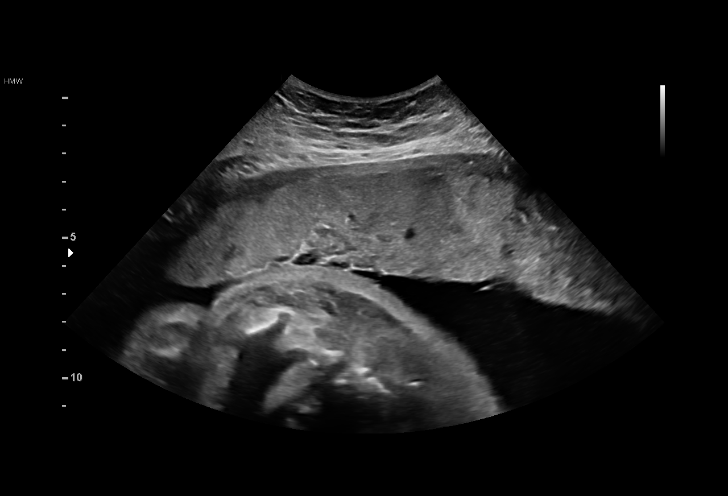
[im 19/72]
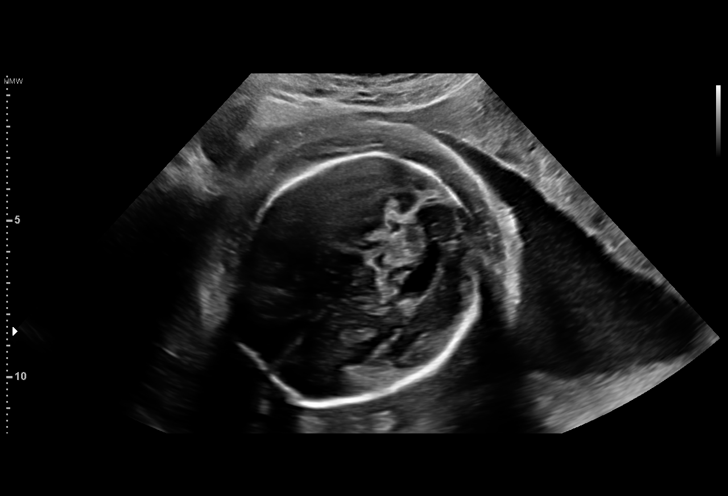
[im 24/72]
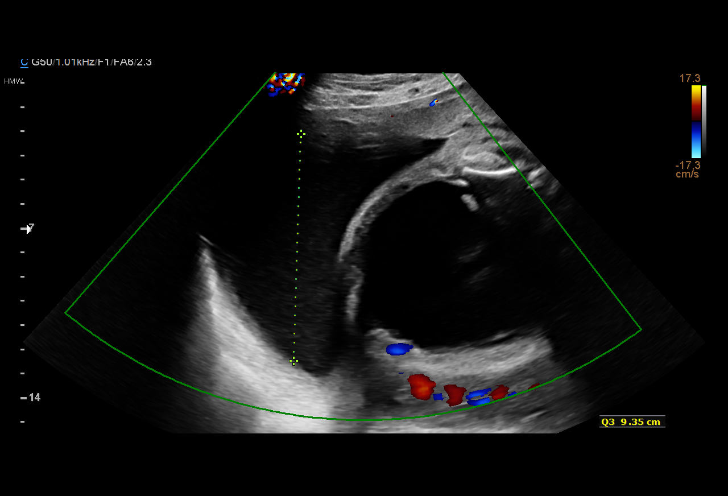
[im 29/72]
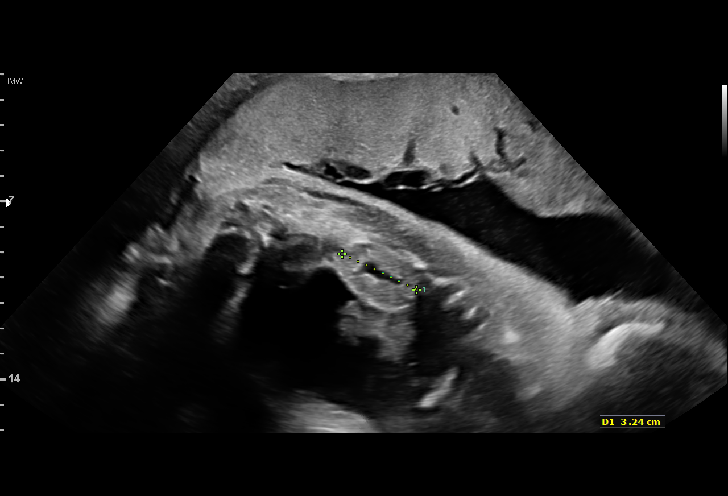
[im 35/72]
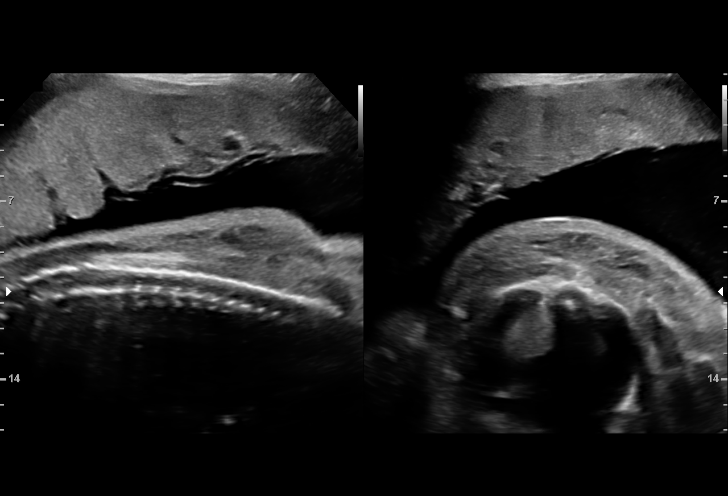
[im 40/72]
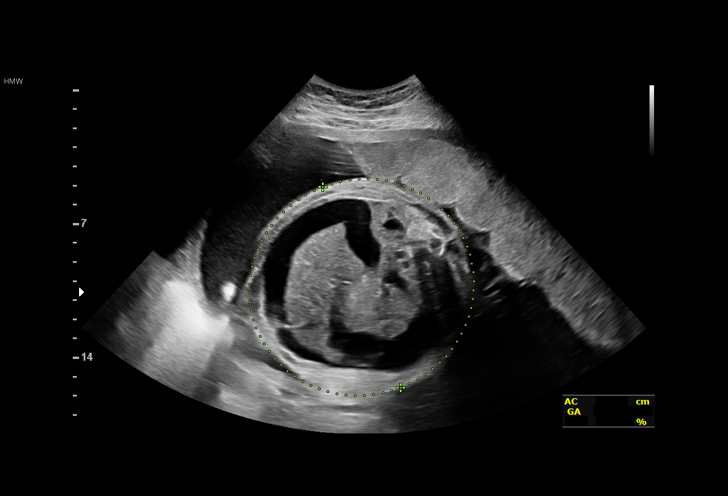
[im 45/72]
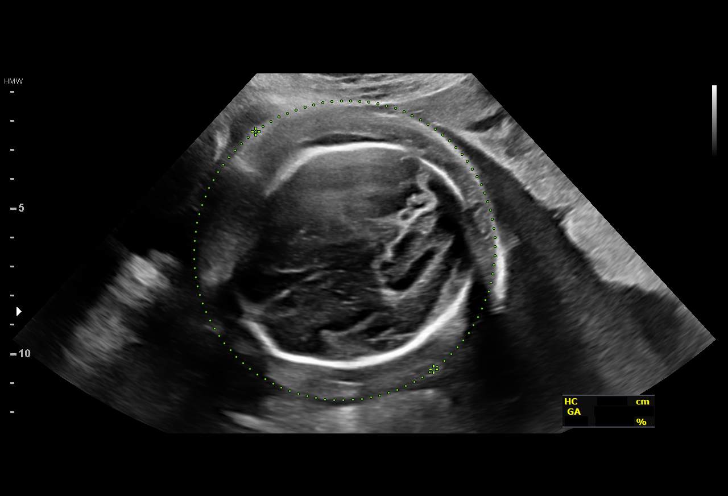
[im 50/72]
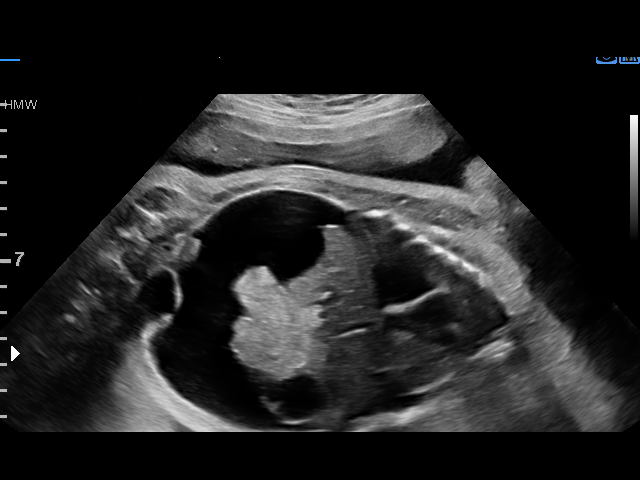
[im 56/72]
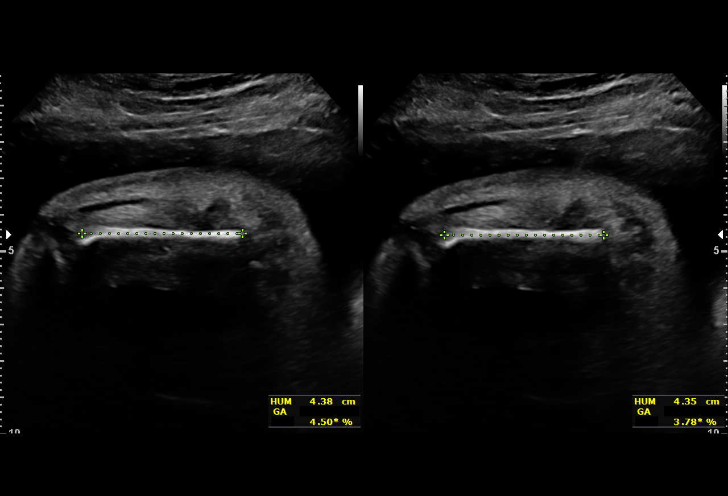
[im 61/72]
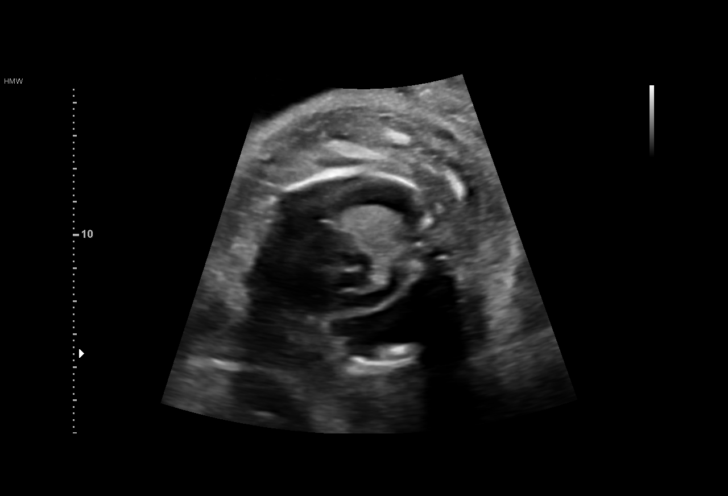
[im 66/72]
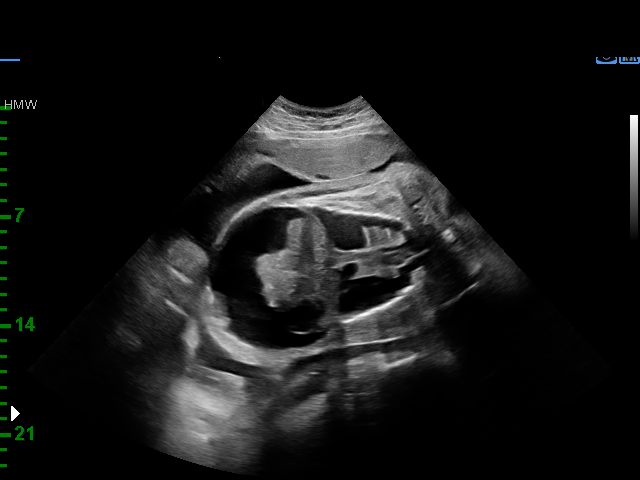
[im 72/72]
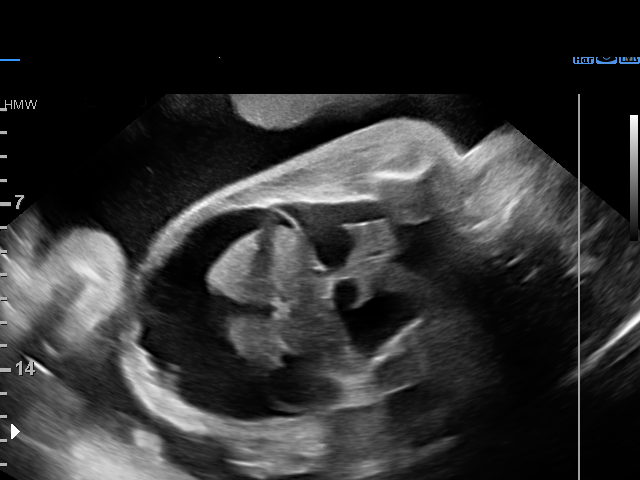

[14 of 28 positions shown; findings below may reference images not displayed]

1  NOHAYELA LYOBI           [id number]      [id number]     [id number]
2  NOHAYELA LYOBI           [id number]      [id number]     [id number]
Indications

29 weeks gestation of pregnancy
Encounter for antenatal screening for
malformations
Gestational hypertension without significant
proteinuria, third trimester
Non-reactive NST
Abnormal biochemical screen (quad) for
Trisomy 21
OB History

Gravidity:    2         Term:   1
Living:       1
Fetal Evaluation

Num Of Fetuses:     1
Fetal Heart         113
Rate(bpm):
Cardiac Activity:   Observed
Presentation:       Cephalic
Placenta:           Anterior, above cervical os
P. Cord Insertion:  Visualized, central

Amniotic Fluid
AFI FV:      Polyhydramnios

AFI Sum(cm)     %Tile       Largest Pocket(cm)
25.38           97
RUQ(cm)       RLQ(cm)       LUQ(cm)        LLQ(cm)
4.44
Biophysical Evaluation

Amniotic F.V:   Polyhydramnios             F. Tone:        Observed
F. Movement:    Observed                   Score:          [DATE]
F. Breathing:   Not Observed
Biometry

BPD:      72.2  mm     G. Age:  29w 0d         32  %    CI:        85.78   %    70 - 86
FL/HC:      20.3   %    19.6 -
HC:      245.5  mm     G. Age:  26w 5d        < 3  %    HC/AC:      0.67        0.99 -
AC:      366.7  mm     G. Age:  40w 4d       > 97  %    FL/BPD:     69.1   %    71 - 87
FL:       49.9  mm     G. Age:  26w 6d        < 3  %    FL/AC:      13.6   %    20 - 24
HUM:      43.7  mm     G. Age:  26w 0d        < 5  %
Est. FW:    5507  gm      5 lb 8 oz   > 90  %
Gestational Age

LMP:           25w 4d        Date:  07/16/16                 EDD:   04/22/17
U/S Today:     30w 6d                                        EDD:   03/16/17
Best:          29w 1d     Det. By:  Previous Ultrasound      EDD:   03/28/17
(11/05/16)
Anatomy

Cranium:               Appears normal         Aortic Arch:            Not well visualized
Cavum:                 Appears normal         Ductal Arch:            Not well visualized
Ventricles:            Appears normal         Diaphragm:              Appears normal
Choroid Plexus:        Not well visualized    Stomach:                Appears normal, left
sided
Cerebellum:            Not well visualized    Abdomen:                Ascites
Posterior Fossa:       Not well visualized    Abdominal Wall:         Not well visualized
Nuchal Fold:           Not well visualized    Cord Vessels:           Not well visualized
Face:                  Not well visualized    Kidneys:                Appear normal
Lips:                  Not well visualized    Bladder:                Appears normal
Thoracic:              Pericardial and        Spine:                  Appears normal
pleural effusion
Heart:                 Abnormal, see          Upper Extremities:      Visualized
comments
RVOT:                  Abnormal, see          Lower Extremities:      Visualized
comments
LVOT:                  Abnormal, see
comments

Other:  Significant skin thickening around skill and chest.
Cervix Uterus Adnexa

Cervix
Normal appearance by transabdominal scan.

Uterus
No abnormality visualized.

Left Ovary
No adnexal mass visualized.
Right Ovary
No adnexal mass visualized.

Cul De Sac:   No free fluid seen.

Adnexa:       No abnormality visualized.
Impression

Single IUP at 29w 1d
Fetus with Trisomy 21 by NIPT
Fetal Hydrops noted - polyhydramnios, diffuse skin edema,
pleural effusion and ascites noted
BPP [DATE] (-2 for absent fetal breathing)
The estimated fetal weight is > 90th %tile, but likely
overestimated due to diffuse skin edema
Anterior placenta
Recommendations

See separate consult note
NICU consultation

## 2019-05-03 LAB — URINALYSIS W/ RFLX MICROSCOPIC
Glucose, UA: NEGATIVE mg/dL
Ketones, Urine: 15 mg/dL — AB
Nitrite, Urine: POSITIVE — AB
Protein, UA: 100 — AB
Specific Gravity, UA: >=1.03 (ref 1.003–1.035)
Urobilinogen, Urine: 1 EU/dL
pH, UA: 6.5 (ref 4.5–8.0)

## 2019-05-03 LAB — CBC WITH AUTO DIFFERENTIAL
Absolute Baso #: 0 10*3/uL (ref 0.0–0.2)
Absolute Eos #: 0.1 10*3/uL (ref 0.0–0.5)
Absolute Lymph #: 1.6 10*3/uL (ref 1.0–3.2)
Absolute Mono #: 0.8 10*3/uL (ref 0.3–1.0)
Basophils %: 0.3 % (ref 0.0–2.0)
Eosinophils %: 0.5 % (ref 0.0–7.0)
Hematocrit: 18.8 % — CL (ref 34.0–47.0)
Hemoglobin: 6.1 g/dL — CL (ref 11.5–15.7)
Immature Grans (Abs): 0.03 10*3/uL (ref 0.00–0.06)
Immature Granulocytes: 0.3 % (ref 0.1–0.6)
Lymphocytes: 16.6 % (ref 15.0–45.0)
MCH: 31.6 pg (ref 27.0–34.5)
MCHC: 32.4 g/dL (ref 32.0–36.0)
MCV: 97.4 fL (ref 81.0–99.0)
MPV: 10.2 fL (ref 7.2–13.2)
Monocytes: 8.5 % (ref 4.0–12.0)
Neutrophils %: 73.8 % (ref 42.0–74.0)
Neutrophils Absolute: 7.2 10*3/uL (ref 1.6–7.3)
Platelets: 339 10*3/uL (ref 140–440)
RBC: 1.93 x10e6/mcL — ABNORMAL LOW (ref 3.60–5.20)
RDW: 13.4 % (ref 11.0–16.0)
WBC: 9.7 10*3/uL (ref 3.8–10.6)

## 2019-05-03 LAB — COMPREHENSIVE METABOLIC PANEL
ALT: 13 U/L (ref 0–33)
AST: 13 U/L (ref 0–32)
Albumin/Globulin Ratio: 1 mmol/L (ref 1.00–2.00)
Albumin: 2.6 g/dL — ABNORMAL LOW (ref 3.5–5.2)
Alk Phosphatase: 66 U/L (ref 35–117)
Anion Gap: 10 mmol/L (ref 2–17)
BUN: 8 mg/dL (ref 6–20)
CALCIUM,CORRECTED,CCA: 9.7 mg/dL (ref 8.6–10.0)
CO2: 25 mmol/L (ref 22–29)
Calcium: 8.6 mg/dL (ref 8.6–10.0)
Chloride: 102 mmol/L (ref 98–107)
Creatinine: 0.5 mg/dL (ref 0.5–0.9)
GFR African American: 155 mL/min/{1.73_m2} (ref >=90)
GFR Non-African American: 134 mL/min/{1.73_m2} (ref >=90)
Globulin: 4 g/dL (ref 1.9–4.4)
Glucose: 100 mg/dL — ABNORMAL HIGH (ref 70–99)
OSMOLALITY CALCULATED: 272 mOsm/kg (ref 270–287)
Potassium: 3.2 mmol/L — ABNORMAL LOW (ref 3.5–5.3)
Sodium: 137 mmol/L (ref 135–145)
Total Bilirubin: 0.37 mg/dL (ref 0.00–1.20)
Total Protein: 6.6 g/dL (ref 6.4–8.3)

## 2019-05-03 LAB — WET PREP, GENITAL
CLUE CELLS WP, TEST92: ABSENT
RBC, Wet Prep: >5 — AB
Trich, Wet Prep: ABSENT
WBC, Wet Prep: ABSENT

## 2019-05-03 LAB — SMEAR FUNGUS: FINAL REPORT: NONE SEEN

## 2019-05-03 LAB — COVID-19, SURVEILLANCE (ASYMPTOMATIC/NO EXPOSURE, OR TEST OF CURE)
Lot/Kit Number: 706075
SARS Cov2 Ag FIA: NEGATIVE

## 2019-05-03 LAB — HCG, QUANTITATIVE, PREGNANCY: hCG Quant: 0.2 m[IU]/mL (ref 0.0–5.3)

## 2019-05-03 LAB — PROTIME-INR
INR: 1 — ABNORMAL LOW (ref 1.5–3.5)
Protime: 13.6 seconds (ref 11.6–14.5)

## 2019-05-03 LAB — LACTIC ACID: Lactic Acid: 0.7 mmol/L (ref 0.5–2.0)

## 2019-05-03 LAB — ABO/RH: ABO/Rh: O POS

## 2019-05-03 LAB — ANTIBODY SCREEN: Antibody Screen: NEGATIVE

## 2019-05-03 LAB — D-DIMER, QUANTITATIVE: D-Dimer, Quant: 1.15 mcg/mL FEU — ABNORMAL HIGH (ref 0.19–0.51)

## 2019-05-03 NOTE — ED Provider Notes (Signed)
General Medical Problem *ED        Patient:   Monica Cline, Monica Cline            MRN: [id number]            FIN: [id number]               Age:   28 years     Sex:  Female     DOB:  Sep 05, 1990   Associated Diagnoses:   Anemia; Vaginal bleeding; UTI symptoms; Hypokalemia; Retained products of conception with hemorrhage   Author:   Jailin Moomaw-MD,  [place] Information   Time seen: Provider Seen (ST)   ED Provider/Time:    Lekisha Mcghee-MD,  Itzel Lowrimore M / 05/03/2019 11:15  .   Additional information: Chief Complaint from Nursing Triage Note   Chief Complaint  Chief Complaint: june 29th had abortion sts heavy bleeding c/o weakness c/o right abd pain (05/03/19 11:18:00).      History of Present Illness   28 year old female G6, P1 presents with multiple chief complaints.  Patient notes that in June she took methotrexate for a abortion.  She notes that the last 5 weeks she has had the vaginal bleeding going through a box of pads a day.  Patient endorsed some generalized abdominal cramping with the bleeding.  Patient notes the last 4 days she has had sharp right-sided abdominal pain.  Patient describes the pain as pleuritic worse with any degree of movement or breathing.  Patient denies fevers she endorses an episode of nausea and vomiting yesterday which has since resolved..        Review of Systems   Constitutional symptoms:  Negative except as documented in HPI.   Skin symptoms:  Negative except as documented in HPI.   Eye symptoms:  Negative except as documented in HPI.   ENMT symptoms:  Negative except as documented in HPI.   Respiratory symptoms:  Negative except as documented in HPI.   Cardiovascular symptoms:  Negative except as documented in HPI.   Gastrointestinal symptoms:  Abdominal pain, right upper quadrant, right lower quadrant, sharp, nausea, vomiting.    Genitourinary symptoms:  Vaginal bleeding.   Musculoskeletal symptoms:  Negative except as documented in HPI.   Neurologic symptoms:  Negative except as documented in HPI.    Psychiatric symptoms:  Negative except as documented in HPI.   Endocrine symptoms:  Negative except as documented in HPI.   Hematologic/Lymphatic symptoms:  Bleeding tendency.      Health Status   Allergies:    Allergic Reactions (Selected)  No Known Allergies.   Medications:    Medications reviewed..      Past Medical/ Family/ Social History   Medical history: Reviewed as documented in chart.   Surgical history: Reviewed as documented in chart.   Family history: Not significant.   Social history: Reviewed as documented in chart.   Problem list:    No qualifying data available  .      Physical Examination               Vital Signs   Vital Signs   3/53/6144 31:54 EDT Systolic Blood Pressure 008 mmHg    Diastolic Blood Pressure 79 mmHg    Temperature Oral 37.4 degC  HI    Heart Rate Monitored 107 bpm  HI    Respiratory Rate 18 br/min    SpO2 100 %   .   Measurements   05/03/2019 11:20 EDT Body  Mass Index est meas 29.73 kg/m2    Body Mass Index Measured 29.73 kg/m2   05/03/2019 11:18 EDT Height/Length Measured 163 cm    Weight Dosing 79 kg   .   Basic Oxygen Information   05/03/2019 11:18 EDT SpO2 100 %    Oxygen Therapy Room air   .   General:  Alert, mild distress.    Skin:  Warm, dry.    Head:  Normocephalic, atraumatic.    Neck:  Supple, trachea midline.    Eye:  Extraocular movements are intact.   Ears, nose, mouth and throat:  Oral mucosa moist.   Cardiovascular:  Tachycardic rate, regular rhythm.   Respiratory:  Respirations are non-labored.   Chest wall:  No tenderness.   Back:  Normal range of motion.   Musculoskeletal:  Normal strength.   Gastrointestinal:  Soft, acutely uncomfortable with onset of crying when laying the patient back right-sided abdominal tenderness however no tenderness at McBurney's and negative Murphy sign, tenderness over right lower ribs.    Genitourinary:  Normal external genitalia exam, large clot burden with mild degree of active bleeding, no lesion or mass seen but no discharge.    Neurological:  Alert and oriented to person, place, time, and situation, normal motor observed, normal speech observed.    Psychiatric:  Anxious, tearful.      Medical Decision Making   Rationale:  28 year old female presents with persistent heavy vaginal bleeding following abortion in June.  Patient has pleuritic pain in her mid right-sided abdomen.  Patient is tearful with movement.  Plan for vaginal ultrasound to evaluate for retained products and basic lab work to evaluate for source of abdominal pain and pleuritic discomfort.   Documents reviewed:  Emergency department nurses' notes.   Results review:  Lab results : Lab View   05/03/2019 13:57 EDT Product Ready Done   05/03/2019 13:12 EDT EABORh Interp O POS    ECABS Interp Negative    Computer XM Interp Compatible   05/03/2019 12:15 EDT UA Color OTHER    UA Appear Turbid    UA Glucose Negative    UA Bili Small    UA Ketones 15 mg/dL    UA Spec Grav >=1.030    UA Blood Large    UA pH 6.5    Protein U 100    UA Urobilinogen 1.0 EU/dL    UA Nitrite Positive    UA Leuk Est Small    WBC U 21-50 /HPF    RBC U TNTC /HPF    Sq Epi U Many /LPF    Bacteria U Few    Mucus U Few /LPF   05/03/2019 12:13 EDT Estimated Creatinine Clearance 174.04 mL/min   05/03/2019 11:57 EDT Lactic Acid Lvl 0.7 mmol/L   05/03/2019 11:46 EDT WBC 9.7 x10e3/mcL    RBC 1.93 x10e6/mcL  LOW    Hgb 6.1 g/dL  CRIT    HCT 18.8 %  CRIT    MCV 97.4 fL    MCH 31.6 pg    MCHC 32.4 g/dL    RDW 13.4 %    Platelet 339 x10e3/mcL    MPV 10.2 fL    Neutro Auto 73.8 %    Neutro Absolute 7.2 x10e3/mcL    Immature Grans Percent 0.3 %    Immature Grans Absolute 0.03 x10e3/mcL    Lymph Auto 16.6 %    Lymph Absolute 1.6 x10e3/mcL    Mono Auto 8.5 %    Mono Absolute 0.8 x10e3/mcL  Eosinophil Percent 0.5 %    Eos Absolute 0.1 x10e3/mcL    Basophil Auto 0.3 %    Baso Absolute 0.0 x10e3/mcL    PT 13.6 seconds    INR 1.0  LOW    D Dimer 1.15 mcg/mL FEU  HI    Sodium Lvl 137 mmol/L    Potassium Lvl 3.2 mmol/L  LOW     Chloride 102 mmol/L    CO2 25 mmol/L    Glucose Random 100 mg/dL  HI    BUN 8 mg/dL    Creatinine Lvl 0.5 mg/dL    AGAP 10 mmol/L    Osmolality Calc 272 mOsm/kg    Calcium Lvl 8.6 mg/dL    Calcium (Corrected) Lvl 9.7 mg/dL    Protein Total 6.6 g/dL    Albumin Lvl 2.6 g/dL  LOW    Globulin Calc 4.0 g/dL    AG Ratio Calc 1.00 mmol/L    Alk Phos 66 unit/L    AST 13 unit/L    ALT 13 unit/L    eGFR AA 155 mL/min/1.70m???    eGFR Non-AA 134 mL/min/1.767m??    Bili Total 0.37 mg/dL    Beta hCG Qnt 0.2 mIU/mL    KOH Smear See Result    WpTrich Absent    WpClue Absent    WpEpithelial Present    WpWBC Absent    WpRBC >5   .   Radiology results:  Rad Results (ST)   CT Angiography Chest w/ + w/o Contrast  ?  05/03/19 14:05:26  CT chest with contrast: 05/03/19    INDICATION:PE suspected, high pretest prob.    COMPARISON: Chest radiograph 07/11/2011    TECHNIQUE: PE protocol (Postcontrast imaging from the thoracic inlet through the  hemidiaphragms in the pulmonary arterial phase. Axial 5x5 mm soft tissue and  lung 2x2 mm images. Multiplanar 3-D volumetric MIP reconstructions through the  pulmonary arteries per protocol.) CT scanning was performed using radiation  dose reduction techniques when appropriate, per system protocols.    FINDINGS:    Vascular: Evaluation of the pulmonary arteries is diagnostic to the segmental  arterial level. No pulmonary embolism. The main pulmonary artery is normal in  caliber. The aorta is normal in course and caliber.    Mediastinum: Normal heart size. No pericardial effusion. Imaged thyroid gland is  unremarkable. Mild stranding in the mediastinum may reflect residual thymic  tissue.    Lymph nodes: No pathologically enlarged thoracic lymph nodes.    Airways: Central airways are patent.    Lungs/Pleura: No focal consolidation. Atelectasis at the lung bases. No  pulmonary edema. No pleural effusion. No pneumothorax.    Upper abdomen: No acute findings.    Bones and soft tissues: No acute osseous  abnormality.    IMPRESSION:    1. Negative for pulmonary embolism.  2. No focal consolidation.  ?  Signed By: OBMilly Jakob Notes:  Given pleuritic pain and elevated d-dimer with slightly elevated temperature and tachycardia plan for CTA to rule out pulmonary embolism.  H&H show significant anemia patient will need transfusion plan for ultrasound to evaluate for possible retained products as cause of pain and bleeding.      Reexamination/ Reevaluation   Vital signs   Basic Oxygen Information   05/03/2019 11:18 EDT SpO2 100 %    Oxygen Therapy Room air      Course: improving.   Notes: Patient updated on need for admission for transfusion.  Consented for blood..Marland Kitchen  Impression and Plan   Diagnosis   Anemia (ICD10-CM D64.9, Discharge, Medical)   Vaginal bleeding (ICD10-CM N93.9, Discharge, Medical)   UTI symptoms (ICD10-CM R39.9, Discharge, Medical)   Hypokalemia (ICD10-CM E87.6, Discharge, Medical)   Retained products of conception with hemorrhage (ICD10-CM O72.2, Discharge, Medical)      Calls-Consults   -  Delton Prairie F, gynecology, consult, We will follow-up her ultrasound and do D&C if necessary for retained products.    Plan   Condition: Guarded.    Disposition: Place in Observation Unit.    Counseled: Patient, Regarding diagnosis, Regarding diagnostic results, Regarding treatment plan.    Signature Line     Electronically Signed on 05/03/2019 03:01 PM EDT   ________________________________________________   Vonna Kotyk               Modified by: Vonna Kotyk on 05/03/2019 01:11 PM EDT      Modified by: Vonna Kotyk on 05/03/2019 02:20 PM EDT      Modified by: Freddie Breech M on 05/03/2019 02:34 PM EDT      Modified by: Freddie Breech M on 05/03/2019 02:47 PM EDT      Modified by: Freddie Breech M on 05/03/2019 03:01 PM EDT

## 2019-05-03 NOTE — Discharge Summary (Signed)
 ED Clinical Summary                         [facility]  [address]  [place], [place] [address]  [phone]           PERSON INFORMATION  Name: Monica Cline, Monica Cline Age:  28 Years DOB: [birth year]   Sex: Female Language: English PCP: PCP,  NONE   Marital Status:  Single Phone: [phone] Med Service: MED-Medicine   MRN:  [id number] Acct# [id number] Arrival: 05/03/2019 11:06:00   Visit Reason: Vaginal bleeding; ABD PAIN/VAGINAL BLEEDING Acuity: 3 LOS: 000 05:07   Address:      [address] [place] [address]  Diagnosis:      Anemia; Hypokalemia; Retained products of conception, postpartum; Retained products of conception with hemorrhage; UTI symptoms; Vaginal bleeding  Printed Prescriptions:            Allergies      No Known Allergies      Medications Administered During Visit:                  Medication Dose Route   ondansetron 4 mg IV Push   morphine 2 mg IV Push   Sodium Chloride 0.9% 1000 mL IV Piggyback   ceftriaxone 1 g IV Piggyback   iopamidol 100 mL IV Contrast   potassium chloride 20 mEq Oral       Patient Medication List:              No Known Home Medications         Major Tests and Procedures:  The following procedures and tests were performed during your ED visit.  COMMONPROCEDURES%>  COMMON PROCEDURESCOMMENTS%>          Laboratory Orders  Name Status Details   .SARS COV2 Ag FIA Completed Nasal Swab, Stat, ST - Stat, 05/03/19 13:59:00 EDT, 05/03/19 13:59:00 EDT, Nurse collect, SMITH-MD, MD, MAUDE MANGO, Print label Y/N   C Blood Ordered Blood, Stat, ST - Stat, 05/03/19 11:41:00 EDT, 05/03/19 11:41:00 EDT, 2 of 2   C Blood Ordered Blood, Stat, ST - Stat, 05/03/19 11:41:00 EDT, 05/03/19 11:41:00 EDT, 1 of 2   CBCDIFF Completed Blood, Stat, ST - Stat, 05/03/19 11:33:00 EDT, 05/03/19 11:33:00 EDT, Nurse collect, POPE-MD,  SABRINA M, Print label Y/N   CMP Completed Blood, Stat, ST - Stat, 05/03/19 11:33:00 EDT, 05/03/19 11:33:00 EDT, Nurse collect, POPE-MD,  SABRINA M,  Print label Y/N   CT GC Genital Ordered Genital, Stat, ST - Stat, 05/03/19 11:33:00 EDT, Once, 05/03/19 11:33:00 EDT, Nurse collect   Crossmatch InProcess Blood, Stat, ST - Stat, Collected, 05/03/19 13:12:00 EDT, Nurse collect, 05/03/19 13:12:00 EDT, [id number]   D Dimer Completed Blood, Stat, ST - Stat, 05/03/19 11:33:00 EDT, 05/03/19 11:33:00 EDT, Nurse collect, POPE-MD,  SAMULE HERO, Print label Y/N   EABORh Completed Blood, Stat, ST - Stat, Collected, 05/03/19 13:12:00 EDT, Nurse collect, 05/03/19 13:12:00 EDT, [id number]   EABS Completed Blood, Stat, ST - Stat, Collected, 05/03/19 13:12:00 EDT, Nurse collect, 05/03/19 13:12:00 EDT, [id number]   HCG QT Completed Blood, Stat, ST - Stat, 05/03/19 11:33:00 EDT, 05/03/19 11:33:00 EDT, Nurse collect, POPE-MD,  SAMULE M, Print label Y/N   KOH Completed Vaginal, Stat, ST - Stat, 05/03/19 11:33:00 EDT, 05/03/19 11:33:00 EDT, Nurse collect   Lactic Completed Blood, Stat, ST - Stat, 05/03/19 11:40:00 EDT, 05/03/19 11:40:00 EDT, Nurse collect, POPE-MD,  SAMULE HERO, Print label  Y/N   PABORh Completed Blood, Stat, ST - Stat, 05/03/19 12:56:00 EDT, Nurse collect, Print label Y/N   PABS Completed Blood, Stat, ST - Stat, 05/03/19 12:56:00 EDT, Nurse collect, Print label Y/N   PT INR Completed Blood, Stat, ST - Stat, 05/03/19 11:33:00 EDT, 05/03/19 11:33:00 EDT, Nurse collect, POPE-MD,  SABRINA M, Print label Y/N   Red Blood Cells Completed Blood, Stat, ST - Stat, Collected, 05/03/19 13:57:00 EDT POPE-MD,  SABRINA M, 1, Emergency, Print label Y/N   Type and Screen Completed Blood, Stat, ST - Stat, 05/03/19 12:56:00 EDT, Nurse collect, Print label Y/N   UA Rflx Micro Completed Urine, Clean Catch, Stat, ST - Stat, 05/03/19 11:33:00 EDT, Once, 05/03/19 11:33:00 EDT, Nurse collect   Wet Prep Completed Vaginal, Stat, ST - Stat, 05/03/19 11:33:00 EDT, 05/03/19 11:33:00 EDT, Nurse collect, POPE-MD,  SAMULE HERO, Print label Y/N               Radiology Orders  Name Status  Details   CT Angiography Chest w/ + w/o Contrast Completed 05/03/19 12:56:00 EDT, STAT 1 hour or less, Reason: PE suspected, high pretest prob, Transport Mode: STRETCHER, Rad Type, pp_set_radiology_subspecialty   US  Transvaginal Non OB Completed 05/03/19 11:33:00 EDT, STAT 1 hour or less, Reason: Pain, Pelvic, Transport Mode: STRETCHER, pp_set_radiology_subspecialty               Patient Care Orders  Name Status Details   Bed Request Communication Ordered 05/03/19 14:59:00 EDT med/surg, anemia, vaginal bleeding, retained produ   COVID-19 Status Ordered 05/03/19 13:59:49 EDT, NOT VALID FOR pharmacy, laboratory, radiology., 05/03/19 13:59:49 EDT, COVID-19 Not Detected   Change attending to Ordered 05/03/19 14:59:00 EDT, STACEY MICKEY RAYA F   Communication to Nursing Completed 05/03/19 13:57:00 EDT, Consent to chart, 05/03/19 13:57:00 EDT, 05/03/19 13:57:00 EDT   DC ISO Order/Icons Ordered 05/03/19 14:53:55 EDT, 05/03/19 14:53:55 EDT   ED Assessment Adult Completed 05/03/19 11:20:49 EDT, 05/03/19 11:20:49 EDT   ED Blood Ready Alert Ordered 05/03/19 14:22:20 EDT   ED Secondary Triage Completed 05/03/19 11:20:49 EDT, 05/03/19 11:20:49 EDT   ED Triage Adult Completed 05/03/19 11:06:38 EDT, 05/03/19 11:06:38 EDT   Notify Provider Completed 05/03/19 13:57:00 EDT, Elevated temperature before transfusion, 05/03/19 13:57:00 EDT, 05/03/19 13:57:00 EDT, Once   Notify Provider Completed 05/03/19 13:57:00 EDT, Temperature increase greater than 1 degree C during transfusion, 05/03/19 13:57:00 EDT, 05/03/19 13:57:00 EDT, Once   Notify Provider Completed 05/03/19 13:59:49 EDT, This message can only be seen by Nursing, it is not visible to Pharmacy, Laboratory, or Radiology., 05/03/19 13:59:49 EDT   Order to Transfuse Completed 05/03/19 13:57:00 EDT, HgB <7, 05/03/19 13:57:00 EDT, PRBC, Rapid (as tolerated, up to 240 mL/hr)   Patient Isolation Ordered 05/03/19 13:59:00 EDT, Contact and Airborne, Constant Indicator   Pelvic  Exam Setup Completed 05/03/19 11:33:00 EDT, Once, 05/03/19 11:33:00 EDT             PROVIDER INFORMATION               Provider Role Assigned Sampson OVID SAMULE [facility] ED Provider 05/03/2019 11:15:06    Darcia, RN, Darryle HERO ED Nurse 05/03/2019 11:28:53        Attending Physician:  STACEY MICKEY RAYA JULIANNA     Admit Doc  STACEY MICKEY RAYA JULIANNA     Consulting Doc  STACEY MICKEY RAYA JULIANNA     VITALS INFORMATION  Vital Sign Triage Latest   Temp Oral ORAL_1%>37.4 degC ORAL%>37.0 degC   Temp Temporal TEMPORAL_1%>  TEMPORAL%>   Temp Intravascular INTRAVASCULAR_1%> INTRAVASCULAR%>   Temp Axillary AXILLARY_1%> AXILLARY%>   Temp Rectal RECTAL_1%> RECTAL%>   02 Sat 100 % 100 %   Respiratory Rate RATE_1%>18 br/min RATE%>18 br/min   Peripheral Pulse Rate PULSE RATE_1%>95 bpm PULSE RATE%>107 bpm   Apical Heart Rate HEART RATE_1%> HEART RATE%>   Blood Pressure BLOOD PRESSURE_1%>/ BLOOD PRESSURE_1%>79 mmHg BLOOD PRESSURE%>116 mmHg / BLOOD PRESSURE%>62 mmHg                 Immunizations      No Immunizations Documented This Visit          DISCHARGE INFORMATION   Discharge Disposition: S Outpt-Admitted As Inpatient   Discharge Location:    [facility]   Discharge Date and Time:       ED Checkout Date and Time:    05/03/2019 16:13:03     DEPART REASON INCOMPLETE INFORMATION             Problems      No Problems Documented              Smoking Status      No Smoking Status Documented         PATIENT EDUCATION INFORMATION  Instructions:            Follow up:          ED PROVIDER DOCUMENTATION     Patient:   Monica Cline, Monica Cline            MRN: [id number]            FIN: [id number]               Age:   79 years     Sex:  Female     DOB:  Jun 23, 1991   Associated Diagnoses:   Anemia; Vaginal bleeding; UTI symptoms; Hypokalemia; Retained products of conception with hemorrhage   Author:   OVID SAMULE HERO      Basic Information   Time seen: Provider Seen (ST)   ED Provider/Time:    POPE-MD,  SABRINA M / 05/03/2019 11:15  .    Additional information: Chief Complaint from Nursing Triage Note   Chief Complaint  Chief Complaint: june 29th had abortion sts heavy bleeding c/o weakness c/o right abd pain (05/03/19 11:18:00).      History of Present Illness   28 year old female G6, P1 presents with multiple chief complaints.  Patient notes that in June she took methotrexate for a abortion.  She notes that the last 5 weeks she has had the vaginal bleeding going through a box of pads a day.  Patient endorsed some generalized abdominal cramping with the bleeding.  Patient notes the last 4 days she has had sharp right-sided abdominal pain.  Patient describes the pain as pleuritic worse with any degree of movement or breathing.  Patient denies fevers she endorses an episode of nausea and vomiting yesterday which has since resolved..        Review of Systems   Constitutional symptoms:  Negative except as documented in HPI.   Skin symptoms:  Negative except as documented in HPI.   Eye symptoms:  Negative except as documented in HPI.   ENMT symptoms:  Negative except as documented in HPI.   Respiratory symptoms:  Negative except as documented in HPI.   Cardiovascular symptoms:  Negative except as documented in HPI.   Gastrointestinal symptoms:  Abdominal pain, right upper quadrant, right lower quadrant, sharp, nausea,  vomiting.    Genitourinary symptoms:  Vaginal bleeding.   Musculoskeletal symptoms:  Negative except as documented in HPI.   Neurologic symptoms:  Negative except as documented in HPI.   Psychiatric symptoms:  Negative except as documented in HPI.   Endocrine symptoms:  Negative except as documented in HPI.   Hematologic/Lymphatic symptoms:  Bleeding tendency.      Health Status   Allergies:    Allergic Reactions (Selected)  No Known Allergies.   Medications:    Medications reviewed..      Past Medical/ Family/ Social History   Medical history: Reviewed as documented in chart.   Surgical history: Reviewed as documented in chart.   Family  history: Not significant.   Social history: Reviewed as documented in chart.   Problem list:    No qualifying data available  .      Physical Examination               Vital Signs   Vital Signs   05/03/2019 11:18 EDT Systolic Blood Pressure 140 mmHg    Diastolic Blood Pressure 79 mmHg    Temperature Oral 37.4 degC  HI    Heart Rate Monitored 107 bpm  HI    Respiratory Rate 18 br/min    SpO2 100 %   .   Measurements   05/03/2019 11:20 EDT Body Mass Index est meas 29.73 kg/m2    Body Mass Index Measured 29.73 kg/m2   05/03/2019 11:18 EDT Height/Length Measured 163 cm    Weight Dosing 79 kg   .   Basic Oxygen Information   05/03/2019 11:18 EDT SpO2 100 %    Oxygen Therapy Room air   .   General:  Alert, mild distress.    Skin:  Warm, dry.    Head:  Normocephalic, atraumatic.    Neck:  Supple, trachea midline.    Eye:  Extraocular movements are intact.   Ears, nose, mouth and throat:  Oral mucosa moist.   Cardiovascular:  Tachycardic rate, regular rhythm.   Respiratory:  Respirations are non-labored.   Chest wall:  No tenderness.   Back:  Normal range of motion.   Musculoskeletal:  Normal strength.   Gastrointestinal:  Soft, acutely uncomfortable with onset of crying when laying the patient back right-sided abdominal tenderness however no tenderness at McBurney's and negative Murphy sign, tenderness over right lower ribs.    Genitourinary:  Normal external genitalia exam, large clot burden with mild degree of active bleeding, no lesion or mass seen but no discharge.   Neurological:  Alert and oriented to person, place, time, and situation, normal motor observed, normal speech observed.    Psychiatric:  Anxious, tearful.      Medical Decision Making   Rationale:  28 year old female presents with persistent heavy vaginal bleeding following abortion in June.  Patient has pleuritic pain in her mid right-sided abdomen.  Patient is tearful with movement.  Plan for vaginal ultrasound to evaluate for retained products and basic lab  work to evaluate for source of abdominal pain and pleuritic discomfort.   Documents reviewed:  Emergency department nurses' notes.   Results review:  Lab results : Lab View   05/03/2019 13:57 EDT Product Ready Done   05/03/2019 13:12 EDT EABORh Interp O POS    ECABS Interp Negative    Computer XM Interp Compatible   05/03/2019 12:15 EDT UA Color OTHER    UA Appear Turbid    UA Glucose Negative    UA Bili  Small    UA Ketones 15 mg/dL    UA Spec Grav >=8.969    UA Blood Large    UA pH 6.5    Protein U 100    UA Urobilinogen 1.0 EU/dL    UA Nitrite Positive    UA Leuk Est Small    WBC U 21-50 /HPF    RBC U TNTC /HPF    Sq Epi U Many /LPF    Bacteria U Few    Mucus U Few /LPF   05/03/2019 12:13 EDT Estimated Creatinine Clearance 174.04 mL/min   05/03/2019 11:57 EDT Lactic Acid Lvl 0.7 mmol/L   05/03/2019 11:46 EDT WBC 9.7 x10e3/mcL    RBC 1.93 x10e6/mcL  LOW    Hgb 6.1 g/dL  CRIT    HCT 81.1 %  CRIT    MCV 97.4 fL    MCH 31.6 pg    MCHC 32.4 g/dL    RDW 86.5 %    Platelet 339 x10e3/mcL    MPV 10.2 fL    Neutro Auto 73.8 %    Neutro Absolute 7.2 x10e3/mcL    Immature Grans Percent 0.3 %    Immature Grans Absolute 0.03 x10e3/mcL    Lymph Auto 16.6 %    Lymph Absolute 1.6 x10e3/mcL    Mono Auto 8.5 %    Mono Absolute 0.8 x10e3/mcL    Eosinophil Percent 0.5 %    Eos Absolute 0.1 x10e3/mcL    Basophil Auto 0.3 %    Baso Absolute 0.0 x10e3/mcL    PT 13.6 seconds    INR 1.0  LOW    D Dimer 1.15 mcg/mL FEU  HI    Sodium Lvl 137 mmol/L    Potassium Lvl 3.2 mmol/L  LOW    Chloride 102 mmol/L    CO2 25 mmol/L    Glucose Random 100 mg/dL  HI    BUN 8 mg/dL    Creatinine Lvl 0.5 mg/dL    AGAP 10 mmol/L    Osmolality Calc 272 mOsm/kg    Calcium Lvl 8.6 mg/dL    Calcium (Corrected) Lvl 9.7 mg/dL    Protein Total 6.6 g/dL    Albumin Lvl 2.6 g/dL  LOW    Globulin Calc 4.0 g/dL    AG Ratio Calc 8.99 mmol/L    Alk Phos 66 unit/L    AST 13 unit/L    ALT 13 unit/L    eGFR AA 155 mL/min/1.58m    eGFR Non-AA 134 mL/min/1.18m    Bili Total 0.37  mg/dL    Beta hCG Qnt 0.2 mIU/mL    KOH Smear See Result    WpTrich Absent    WpClue Absent    WpEpithelial Present    WpWBC Absent    WpRBC >5   .   Radiology results:  Rad Results (ST)   CT Angiography Chest w/ + w/o Contrast  ?  05/03/19 14:05:26  CT chest with contrast: 05/03/19    INDICATION:PE suspected, high pretest prob.    COMPARISON: Chest radiograph 07/11/2011    TECHNIQUE: PE protocol (Postcontrast imaging from the thoracic inlet through the  hemidiaphragms in the pulmonary arterial phase. Axial 5x5 mm soft tissue and  lung 2x2 mm images. Multiplanar 3-D volumetric MIP reconstructions through the  pulmonary arteries per protocol.) CT scanning was performed using radiation  dose reduction techniques when appropriate, per system protocols.    FINDINGS:    Vascular: Evaluation of the pulmonary arteries is diagnostic to the segmental  arterial  level. No pulmonary embolism. The main pulmonary artery is normal in  caliber. The aorta is normal in course and caliber.    Mediastinum: Normal heart size. No pericardial effusion. Imaged thyroid gland is  unremarkable. Mild stranding in the mediastinum may reflect residual thymic  tissue.    Lymph nodes: No pathologically enlarged thoracic lymph nodes.    Airways: Central airways are patent.    Lungs/Pleura: No focal consolidation. Atelectasis at the lung bases. No  pulmonary edema. No pleural effusion. No pneumothorax.    Upper abdomen: No acute findings.    Bones and soft tissues: No acute osseous abnormality.    IMPRESSION:    1. Negative for pulmonary embolism.  2. No focal consolidation.  ?  Signed By: GRAYLING LUDIE GORMAN SABRA   Notes:  Given pleuritic pain and elevated d-dimer with slightly elevated temperature and tachycardia plan for CTA to rule out pulmonary embolism.  H&H show significant anemia patient will need transfusion plan for ultrasound to evaluate for possible retained products as cause of pain and bleeding.      Reexamination/ Reevaluation   Vital  signs   Basic Oxygen Information   05/03/2019 11:18 EDT SpO2 100 %    Oxygen Therapy Room air      Course: improving.   Notes: Patient updated on need for admission for transfusion.  Consented for blood..      Impression and Plan   Diagnosis   Anemia (ICD10-CM D64.9, Discharge, Medical)   Vaginal bleeding (ICD10-CM N93.9, Discharge, Medical)   UTI symptoms (ICD10-CM R39.9, Discharge, Medical)   Hypokalemia (ICD10-CM E87.6, Discharge, Medical)   Retained products of conception with hemorrhage (ICD10-CM O72.2, Discharge, Medical)      Calls-Consults   -  STACEY MICKEY RAYA F, gynecology, consult, We will follow-up her ultrasound and do D&C if necessary for retained products.    Plan   Condition: Guarded.    Disposition: Place in Observation Unit.    Counseled: Patient, Regarding diagnosis, Regarding diagnostic results, Regarding treatment plan.

## 2019-05-03 NOTE — ED Notes (Signed)
 ED Patient Summary                 [facility]  [address], [place], [place] [address]  [phone]  Discharge Instructions (Patient)  _______________________________________     Name:Monica, Cline  DOB:  06/19/1991                   MRN: [id number]                   FIN: [phone]  Reason For Visit: Vaginal bleeding; ABD PAIN/VAGINAL BLEEDING  Final Diagnosis: Anemia; Hypokalemia; Retained products of conception, postpartum; Retained products of conception with hemorrhage; UTI symptoms; Vaginal bleeding     Visit Date: 05/03/2019 11:06:00  Address: [address] [place] [address]  Phone: [phone]     Primary Care Provider:      Name: PCP,  NONE      Phone:         Emergency Department Providers:         Primary Physician:   OVID SAMULE CHRISTELLA [facility] would like to thank you for allowing us  to assist you with your healthcare needs. The following includes patient education materials and information regarding your injury/illness.     Follow-up Instructions:  You were treated today on an emergency basis. If instructed, please contact your primary care provider to arrange for follow-up and for any further concerns. Whether you have been referred to your primary care doctor or a specialist, please follow-up as instructed.      If you do not have a doctor, you may call [phone] for assistance with finding a [facility] care physician or specialist. Staff is available to help schedule you an appointment.      Not sure where to go with questions about your health? We're here for you. The [facility] "Ask a Nurse" Line in staffed by experienced nurses and is a free service to the community, available Monday - Friday from 8AM to 5PM. Call [phone].      If your condition worsens before your follow-up with an outpatient physician, please return to the Emergency Department.       Printed  Prescriptions:    Patient Education Materials:  Discharge Orders       Comment:             Allergy Info: No Known Allergies     Medication Information:  [facility] ED Physicians provided you with a complete list of medications post discharge, if you have been instructed to stop taking a medication please ensure you also follow up with this information to your Primary Care Physician.  Unless otherwise noted, patient will continue to take medications as prescribed prior to the Emergency Room visit.  Any specific questions regarding your chronic medications and dosages should be discussed with your physician(s) and pharmacist.          No Known Home Medications      Medications Administered During Visit:              Medication Dose Route   ondansetron 4 mg IV Push   morphine 2 mg IV Push   Sodium Chloride 0.9% 1000 mL IV Piggyback   ceftriaxone 1 g IV Piggyback   iopamidol 100 mL IV Contrast   potassium chloride 20 mEq Oral  Major Tests and Procedures:  The following procedures and tests were performed during your ED visit.  PROCEDURES%>  PROCEDURES COMMENTS%>          Laboratory Orders  Name Status Details   .SARS COV2 Ag FIA Completed Nasal Swab, Stat, ST - Stat, 05/03/19 13:59:00 EDT, 05/03/19 13:59:00 EDT, Nurse collect, SMITH-MD, MD, MAUDE MANGO, Print label Y/N   C Blood Ordered Blood, Stat, ST - Stat, 05/03/19 11:41:00 EDT, 05/03/19 11:41:00 EDT, 2 of 2   C Blood Ordered Blood, Stat, ST - Stat, 05/03/19 11:41:00 EDT, 05/03/19 11:41:00 EDT, 1 of 2   CBCDIFF Completed Blood, Stat, ST - Stat, 05/03/19 11:33:00 EDT, 05/03/19 11:33:00 EDT, Nurse collect, POPE-MD,  SABRINA M, Print label Y/N   CMP Completed Blood, Stat, ST - Stat, 05/03/19 11:33:00 EDT, 05/03/19 11:33:00 EDT, Nurse collect, POPE-MD,  SABRINA M, Print label Y/N   CT GC Genital Ordered Genital, Stat, ST - Stat, 05/03/19 11:33:00 EDT, Once, 05/03/19 11:33:00 EDT, Nurse collect   Crossmatch InProcess Blood, Stat,  ST - Stat, Collected, 05/03/19 13:12:00 EDT, Nurse collect, 05/03/19 13:12:00 EDT, [id number]   D Dimer Completed Blood, Stat, ST - Stat, 05/03/19 11:33:00 EDT, 05/03/19 11:33:00 EDT, Nurse collect, POPE-MD,  SAMULE HERO, Print label Y/N   EABORh Completed Blood, Stat, ST - Stat, Collected, 05/03/19 13:12:00 EDT, Nurse collect, 05/03/19 13:12:00 EDT, [id number]   EABS Completed Blood, Stat, ST - Stat, Collected, 05/03/19 13:12:00 EDT, Nurse collect, 05/03/19 13:12:00 EDT, [id number]   HCG QT Completed Blood, Stat, ST - Stat, 05/03/19 11:33:00 EDT, 05/03/19 11:33:00 EDT, Nurse collect, POPE-MD,  SABRINA M, Print label Y/N   KOH Completed Vaginal, Stat, ST - Stat, 05/03/19 11:33:00 EDT, 05/03/19 11:33:00 EDT, Nurse collect   Lactic Completed Blood, Stat, ST - Stat, 05/03/19 11:40:00 EDT, 05/03/19 11:40:00 EDT, Nurse collect, POPE-MD,  SABRINA M, Print label Y/N   PABORh Completed Blood, Stat, ST - Stat, 05/03/19 12:56:00 EDT, Nurse collect, Print label Y/N   PABS Completed Blood, Stat, ST - Stat, 05/03/19 12:56:00 EDT, Nurse collect, Print label Y/N   PT INR Completed Blood, Stat, ST - Stat, 05/03/19 11:33:00 EDT, 05/03/19 11:33:00 EDT, Nurse collect, POPE-MD,  SABRINA M, Print label Y/N   Red Blood Cells Completed Blood, Stat, ST - Stat, Collected, 05/03/19 13:57:00 EDT POPE-MD,  SABRINA M, 1, Emergency, Print label Y/N   Type and Screen Completed Blood, Stat, ST - Stat, 05/03/19 12:56:00 EDT, Nurse collect, Print label Y/N   UA Rflx Micro Completed Urine, Clean Catch, Stat, ST - Stat, 05/03/19 11:33:00 EDT, Once, 05/03/19 11:33:00 EDT, Nurse collect   Wet Prep Completed Vaginal, Stat, ST - Stat, 05/03/19 11:33:00 EDT, 05/03/19 11:33:00 EDT, Nurse collect, POPE-MD,  SAMULE HERO, Print label Y/N               Radiology Orders  Name Status Details   CT Angiography Chest w/ + w/o Contrast Completed 05/03/19 12:56:00 EDT, STAT 1 hour or less, Reason: PE suspected, high pretest prob, Transport Mode: STRETCHER,  Rad Type, pp_set_radiology_subspecialty   US  Transvaginal Non OB Completed 05/03/19 11:33:00 EDT, STAT 1 hour or less, Reason: Pain, Pelvic, Transport Mode: STRETCHER, pp_set_radiology_subspecialty               Patient Care Orders  Name Status Details   Bed Request Communication Ordered 05/03/19 14:59:00 EDT med/surg, anemia, vaginal bleeding, retained produ   COVID-19 Status Ordered 05/03/19 13:59:49 EDT, NOT VALID FOR pharmacy, laboratory, radiology., 05/03/19 13:59:49 EDT, COVID-19 Not Detected   Change  attending to Ordered 05/03/19 14:59:00 EDT, STACEY MICKEY RAYA F   Communication to Nursing Completed 05/03/19 13:57:00 EDT, Consent to chart, 05/03/19 13:57:00 EDT, 05/03/19 13:57:00 EDT   DC ISO Order/Icons Ordered 05/03/19 14:53:55 EDT, 05/03/19 14:53:55 EDT   ED Assessment Adult Completed 05/03/19 11:20:49 EDT, 05/03/19 11:20:49 EDT   ED Blood Ready Alert Ordered 05/03/19 14:22:20 EDT   ED Secondary Triage Completed 05/03/19 11:20:49 EDT, 05/03/19 11:20:49 EDT   ED Triage Adult Completed 05/03/19 11:06:38 EDT, 05/03/19 11:06:38 EDT   Notify Provider Completed 05/03/19 13:57:00 EDT, Elevated temperature before transfusion, 05/03/19 13:57:00 EDT, 05/03/19 13:57:00 EDT, Once   Notify Provider Completed 05/03/19 13:57:00 EDT, Temperature increase greater than 1 degree C during transfusion, 05/03/19 13:57:00 EDT, 05/03/19 13:57:00 EDT, Once   Notify Provider Completed 05/03/19 13:59:49 EDT, This message can only be seen by Nursing, it is not visible to Pharmacy, Laboratory, or Radiology., 05/03/19 13:59:49 EDT   Order to Transfuse Completed 05/03/19 13:57:00 EDT, HgB <7, 05/03/19 13:57:00 EDT, PRBC, Rapid (as tolerated, up to 240 mL/hr)   Patient Isolation Ordered 05/03/19 13:59:00 EDT, Contact and Airborne, Constant Indicator   Pelvic Exam Setup Completed 05/03/19 11:33:00 EDT, Once, 05/03/19 11:33:00 EDT        ---------------------------------------------------------------------------------------------------------------------  [facility]) encourages you to self-enroll in the [facility] Patient Portal.  [facility] Patient Portal will allow you to manage your personal health information securely from your own electronic device now and in the future.  To begin your Patient Portal enrollment process, please visit [email or web address] Click on "Sign up now" under [facility].  If you find that you need additional assistance on the [facility] Patient Portal or need a copy of your medical records, please call the [facility] Medical Records Office at [phone].  Comment:

## 2019-05-03 NOTE — Case Communication (Signed)
Discharge Follow-Up Form - Text       Discharge Follow-Up Entered On:  05/04/2019 14:04 EDT    Performed On:  05/04/2019 14:04 EDT by Coralie Common RN, KELLY A               Clinical   Provider Follow-Up Post Discharge RTF :   Follow-Up Appointments    With:  Await pathology results  When:  In 1 day    With:  Janine Limbo  Address:  [address], [place], [place], [address];[phone]  When:  In 2 weeks       SOUTH[place], RN, KELLY A - 05/04/2019 14:04 EDT   Status   Case Status :   Completed   Reason for Removal :   IUFD/Fetal Loss   SOUTHGATE, RN, KELLY A - 05/04/2019 14:04 EDT

## 2019-05-03 NOTE — ED Notes (Signed)
ED Patient Education Note     Patient Education Materials Follows:

## 2019-05-03 NOTE — Nursing Note (Signed)
Nursing Discharge Summary - Text       Nursing Discharge Summary Entered On:  05/03/2019 22:06 EDT    Performed On:  05/03/2019 22:05 EDT by Farrel Conners RN, [place]   Discharge To :   Home independently   Mode of Discharge :   Wheelchair   Transportation :   Private vehicle   Accompanied By :   Lilly [place], RN, Rosalene Billings - 05/03/2019 22:05 EDT   Education   Responsible Learner(s) :   No Data Available     Home Caregiver Present for Session :   Yes   Barriers To Learning :   None evident   Teaching Method :   Demonstration, Explanation, Teach-back   Rella Larve - 05/03/2019 22:05 EDT   Post-Hospital Education Adult Grid   Activity Expectations :   Rosezena Sensor understanding   Bladder Management :   Verbalizes understanding   Community Resources :   [employer] understanding   Diagnostic Results :   Verbalizes understanding   Disease Process :   Verbalizes understanding   Equipment/Devices :   Science writer understanding   Importance of Follow-Up Visits :   Verbalizes understanding   Invasive Line Care :   Science writer understanding   Pain Management :   Verbalizes understanding   Physical Limitations :   Verbalizes understanding   Plan of Care :   Verbalizes understanding   Postoperative Instructions :   Science writer understanding   Substance Abuse :   Verbalizes understanding   When to Call Health Care Provider :   [employer] understanding   Rella Larve - 05/03/2019 22:05 EDT   Health Maintenance Education Adult Grid   Allergies :   Verbalizes understanding   Diet/Nutrition :   Verbalizes understanding   Farrel Conners, RN, Rosalene Billings - 05/03/2019 22:05 EDT   Medication Education Adult Grid   Drug to Food Interactions :   Science writer understanding   Med Dosage, Route, Scheduling :   Science writer understanding   Med Generic/Brand Name, Purpose, Action :   Verbalizes understanding   Medication Precautions :   Banker, Medication :   Verbalizes understanding   Rella Larve 05/03/2019 22:05 EDT   Safety Education Adult Grid   Safety, Fall :   [place] understanding   Farrel Conners RN, Rosalene Billings - 05/03/2019 22:05 EDT   Additional Learner(s) Present :   Friend   Time Spent Educating Patient :   20 minutes   Farrel Conners RN, Rosalene Billings - 05/03/2019 22:05 EDT

## 2019-05-03 NOTE — Nursing Note (Signed)
Nursing Discharge Summary - Text       Physician Discharge Summary Entered On:  05/03/2019 22:01 EDT    Performed On:  05/03/2019 22:00 EDT by Vassie Loll RN, Ernest Haber               DC Information   Provider Instructions for Diet :   Regular diet   Provider Instructions for Activity :   As Grover Canavan, RN, Ernest Haber - 05/03/2019 22:00 EDT

## 2019-05-03 NOTE — ED Notes (Signed)
ED Note-Nursing       ED RN Reassessment Entered On:  05/03/2019 15:52 EDT    Performed On:  05/03/2019 15:49 EDT by Leitha Bleak, RN, Lavonne Chick               ED RN Reassessment   ED RN Progress Note :   per AKinbote MD finish first bag of blood within 30 min, current blood is going at 157ml/h will increase    Leitha Bleak, RN, Lavonne Chick - 05/03/2019 16:08 EDT

## 2019-05-03 NOTE — ED Notes (Signed)
ED Note-Nursing       ED RN Reassessment Entered On:  05/03/2019 16:11 EDT    Performed On:  05/03/2019 15:34 EDT by Leitha Bleak, RN, Lavonne Chick               ED RN Reassessment   ED RN Progress Note :   Bc of decreased systolic bp, Dr.Akinbote paged he came down to round on patient did thourough assessment communicated care plan with patient and confirmed admission   Laurena Spies - 05/03/2019 16:09 EDT

## 2019-05-03 NOTE — ED Notes (Signed)
ED Triage Note       ED Secondary Triage Entered On:  05/03/2019 11:29 EDT    Performed On:  05/03/2019 11:28 EDT by Alda Berthold, RN, Arthor Captain               General Information   Barriers to Learning :   None evident   Languages :   [place]   ED Home Meds Section :   Document assessment   [facility] ED Fall Risk Section :   Document assessment   ED History Section :   Document assessment   ED Advance Directives Section :   Document assessment   ED Palliative Screen :   N/A (prefilled for <65yo)   Alda Berthold, RN, Arthor Captain - 05/03/2019 11:28 EDT   (As Of: 05/03/2019 11:29:33 EDT)   Diagnoses(Active)    Vaginal bleeding  Date:   05/03/2019 ; Diagnosis Type:   Reason For Visit ; Confirmation:   Complaint of ; Clinical Dx:   Vaginal bleeding ; Classification:   Medical ; Clinical Service:   Emergency medicine ; Code:   PNED ; Probability:   0 ; Diagnosis Code:   [id number]             -    Procedure History   (As Of: 05/03/2019 11:29:33 EDT)     Lennette Bihari Fall Risk Assessment Tool   Hx of falling last 3 months ED Fall :   No   Patient confused or disoriented ED Fall :   No   Patient intoxicated or sedated ED Fall :   No   Patient impaired gait ED Fall :   No   Use a mobility assistance device ED Fall :   No   Patient altered elimination ED Fall :   No   [facility] ED Fall Score :   0    Ewers, RNArthor Captain - 05/03/2019 11:28 EDT   ED Advance Directive   Advance Directive :   No   Alda Berthold, RN, Arthor Captain - 05/03/2019 11:28 EDT   Social History   Social History   (As Of: 05/03/2019 11:29:33 EDT)     Med Hx   Medication List   (As Of: 05/03/2019 11:29:33 EDT)   No Known Home Medications     Ewers, RN, Arthor Captain - 05/03/2019 11:29:16

## 2019-05-03 NOTE — ED Notes (Signed)
ED Triage Note       ED Triage Adult Entered On:  05/03/2019 11:20 EDT    Performed On:  05/03/2019 11:18 EDT by Faythe Dingwall, RN, Katrina A               Triage   Chief Complaint :   june 29th had abortion sts heavy bleeding c/o weakness c/o right abd pain   Numeric Rating Pain Scale :   6   [place] Mode of Arrival :   Private vehicle   Infectious Disease Documentation :   Document assessment   Temperature Oral :   37.4 degC(Converted to: 99.3 degF)  (HI)    Heart Rate Monitored :   107 bpm (HI)    Respiratory Rate :   18 br/min   Systolic Blood Pressure :   272 mmHg   Diastolic Blood Pressure :   79 mmHg   SpO2 :   100 %   Oxygen Therapy :   Room air   Patient presentation :   None of the above   Chief Complaint or Presentation suggest infection :   No   Dosing Weight Obtained By :   Patient stated   Weight Dosing :   79 kg(Converted to: 174 lb 3 oz)    Height :   163 cm(Converted to: 5 ft 4 in)    Body Mass Index Dosing :   30 kg/m2   Faythe Dingwall, RN, Katrina A - 05/03/2019 11:18 EDT   Faythe Dingwall, RN, Katrina A - 05/03/2019 11:18 EDT   DCP GENERIC CODE   Tracking Group :   ED [facility] [employer]   [place], RN, Katrina A - 05/03/2019 11:18 EDT   Tracking Acuity :   3   K[place], RN, Katrina A - 05/03/2019 13:35 EDT       ED General Section :   Document assessment   Pregnancy Status :   Patient denies   ED Allergies Section :   Document assessment   ED Reason for Visit Section :   Document assessment   Faythe Dingwall, RN, Katrina A - 05/03/2019 11:18 EDT   ID Risk Screen Symptoms   Recent Travel History :   No recent travel   Close Contact with COVID-19 ID :   No   Last 14 days COVID-19 ID :   No   TB Symptom Screen :   No symptoms   Faythe Dingwall, RN, Katrina A - 05/03/2019 11:18 EDT   Allergies   (As Of: 05/03/2019 11:20:49 EDT)   Allergies (Active)   No Known Allergies  Estimated Onset Date:   Unspecified ; Created ByFaythe Dingwall, RN, Katrina A; Reaction Status:   Active ; Category:   Drug ; Substance:   No Known Allergies ; Type:   Allergy ; Updated  By:   Faythe Dingwall, RN, Katrina Loni Muse; Reviewed Date:   05/03/2019 11:19 EDT        Psycho-Social   Last 3 mo, thoughts killing self/others :   Patient denies   Faythe Dingwall, Therapist, sports, Katrina A - 05/03/2019 11:18 EDT   ED Reason for Visit   (As Of: 05/03/2019 11:20:49 EDT)   Diagnoses(Active)    Vaginal bleeding  Date:   05/03/2019 ; Diagnosis Type:   Reason For Visit ; Confirmation:   Complaint of ; Clinical Dx:   Vaginal bleeding ; Classification:   Medical ; Clinical Service:   Emergency medicine ; Code:   PNED ; Probability:   0 ; Diagnosis  Code:   [id number]

## 2019-05-03 NOTE — Nursing Note (Signed)
Adult Patient History Form-Text       Adult Patient History Entered On:  05/03/2019 16:24 EDT    Performed On:  05/03/2019 16:17 EDT by Jarrett Ables, RN, [place]   Patient Identified :   Identification band   Pregnancy Status :   Patient denies   Jarrett Ables, RN, Jake Michaelis - 05/03/2019 16:17 EDT   Allergies   (As Of: 05/03/2019 16:24:42 EDT)   Allergies (Active)   No Known Allergies  Estimated Onset Date:   Unspecified ; Created ByFaythe Dingwall, RN, Katrina A; Reaction Status:   Active ; Category:   Drug ; Substance:   No Known Allergies ; Type:   Allergy ; Updated By:   Faythe Dingwall, RN, Katrina Loni Muse; Reviewed Date:   05/03/2019 11:34 EDT        Medication History   Medication List   (As Of: 05/03/2019 16:24:42 EDT)   No Known Home Medications     Ewers, RN, Arthor Captain - 05/03/2019 11:29:16      Normal Order    potassium chloride 20 mEq ER Tab  :   potassium chloride 20 mEq ER Tab ; Status:   Completed ; Ordered As Mnemonic:   potassium chloride 20 mEq oral tablet, extended release ; Simple Display Line:   20 mEq, 1 tabs, Oral, Once ; Ordering Provider:   Vonna Kotyk; Catalog Code:   potassium chloride ; Order Dt/Tm:   05/03/2019 13:50:53 EDT          iopamidol 76% Inj Soln 100 mL  :   iopamidol 76% Inj Soln 100 mL ; Status:   Completed ; Ordered As Mnemonic:   Isovue-370 ; Simple Display Line:   100 mL, IV Contrast, Once ; Ordering Provider:   Vonna Kotyk; Catalog Code:   iopamidol ; Order Dt/Tm:   05/03/2019 12:56:17 EDT          cefTRIAXone 1gm/152ml NS ADM  :   cefTRIAXone 1gm/113ml NS ADM ; Status:   Completed ; Ordered As Mnemonic:   cefTRIAXone ( Rocephin ) ; Simple Display Line:   1 g, 100 mL, 200 mL/hr, IV Piggyback, Once ; Ordering Provider:   Vonna Kotyk; Catalog Code:   cefTRIAXone ; Order Dt/Tm:   05/03/2019 12:54:01 EDT          Sodium Chloride 0.9%  :   Sodium Chloride 0.9% ; Status:   Discontinued ; Ordered As Mnemonic:   Sodium Chloride 0.9% bolus ; Simple Display Line:    1,000 mL, IV Piggyback, Once ; Ordering Provider:   POPE-MD,  Gabriel Cirri M; Catalog Code:   Sodium Chloride 0.9% ; Order Dt/Tm:   05/03/2019 12:54:01 EDT          morphine 4 mg/mL preservative-free Sol 1 mL  :   morphine 4 mg/mL preservative-free Sol 1 mL ; Status:   Completed ; Ordered As Mnemonic:   morphine injectable solution (ED) ; Simple Display Line:   2 mg, 0.5 mL, IV Push, Once ; Ordering Provider:   Vonna Kotyk; Catalog Code:   morphine ; Order Dt/Tm:   05/03/2019 11:41:31 EDT          ondansetron 2 mg/mL Inj Soln 2 mL  :   ondansetron 2 mg/mL Inj Soln 2 mL ; Status:   Completed ; Ordered As Mnemonic:   Zofran ; Simple  Display Line:   4 mg, 2 mL, IV Push, Once ; Ordering Provider:   Charolette ForwardPOPE-MD,  SABRINA M; Catalog Code:   ondansetron ; Order Dt/Tm:   05/03/2019 11:41:31 EDT          Sodium Chloride 0.9%  :   Sodium Chloride 0.9% ; Status:   Completed ; Ordered As Mnemonic:   Sodium Chloride 0.9% bolus ; Simple Display Line:   1,000 mL, IV Piggyback, Once ; Ordering Provider:   POPE-MD,  Martie LeeSABRINA M; Catalog Code:   Sodium Chloride 0.9% ; Order Dt/Tm:   05/03/2019 11:41:32 EDT            Problem History   (As Of: 05/03/2019 16:24:42 EDT)   Problems(Active)    Shortness of breath (IMO  :[id number] )  Name of Problem:   Shortness of breath ; Recorder:   SYSTEM,  SYSTEM; Confirmation:   Confirmed ; Classification:   Patient Stated ; Code:   60454:   [id number] ; Last Updated:   05/03/2019 12:07 EDT ; Life Cycle Date:   05/03/2019 ; Life Cycle Status:   Active ; Vocabulary:   IMO          Diagnoses(Active)    Anemia  Date:   05/03/2019 ; Diagnosis Type:   Discharge ; Confirmation:   Confirmed ; Clinical Dx:   Anemia ; Classification:   Medical ; Clinical Service:   Non-Specified ; Code:   ICD-10-CM ; Probability:   0 ; Diagnosis Code:   D64.9      Hypokalemia  Date:   05/03/2019 ; Diagnosis Type:   Discharge ; Confirmation:   Confirmed ; Clinical Dx:   Hypokalemia ; Classification:   Medical ; Clinical Service:   Non-Specified ; Code:    ICD-10-CM ; Probability:   0 ; Diagnosis Code:   E87.6      Retained products of conception with hemorrhage  Date:   05/03/2019 ; Diagnosis Type:   Discharge ; Confirmation:   Confirmed ; Clinical Dx:   Retained products of conception with hemorrhage ; Classification:   Medical ; Clinical Service:   Non-Specified ; Code:   ICD-10-CM ; Probability:   0 ; Diagnosis Code:   O72.2      Retained products of conception, postpartum  Date:   05/03/2019 ; Diagnosis Type:   Discharge ; Confirmation:   Confirmed ; Clinical Dx:   Retained products of conception, postpartum ; Classification:   Medical ; Clinical Service:   Non-Specified ; Code:   ICD-10-CM ; Probability:   0 ; Diagnosis Code:   O72.0      UTI symptoms  Date:   05/03/2019 ; Diagnosis Type:   Discharge ; Confirmation:   Confirmed ; Clinical Dx:   UTI symptoms ; Classification:   Medical ; Clinical Service:   Non-Specified ; Code:   ICD-10-CM ; Probability:   0 ; Diagnosis Code:   R39.9      Vaginal bleeding  Date:   05/03/2019 ; Diagnosis Type:   Reason For Visit ; Confirmation:   Complaint of ; Clinical Dx:   Vaginal bleeding ; Classification:   Medical ; Clinical Service:   Emergency medicine ; Code:   PNED ; Probability:   0 ; Diagnosis Code:   [id number]      Vaginal bleeding  Date:   05/03/2019 ; Diagnosis Type:   Discharge ; Confirmation:   Confirmed ; Clinical Dx:   Vaginal bleeding ; Classification:   Medical ; Clinical Service:   Non-Specified ;  Code:   ICD-10-CM ; Probability:   0 ; Diagnosis Code:   N93.9        Procedure History        -    Procedure History   (As Of: 05/03/2019 16:24:42 EDT)     Immunizations   Last Tetanus :   Greater than 5 years   Olen CordialVan Liew, RN, Clarita CraneDonna Alisa - 05/03/2019 16:17 EDT   ID Risk Screen Symptoms   Recent Travel History :   No recent travel   Close Contact with COVID-19 ID :   No   Last 14 days COVID-19 ID :   No   TB Symptom Screen :   No symptoms   Olen CordialVan Liew, RN, Clarita CraneDonna Alisa - 05/03/2019 16:17 EDT    Nutrition   MST Have You Recently Lost Weight Without Trying? :   No   MST Weight Loss Score :   0    MST Have You Been Eating Poorly? :   Yes   MST Appetite Score :   1    MST Score :   1    MST Interpretation :   Not at risk   Olen CordialVan Liew, RN, Clarita CraneDonna Alisa - 05/03/2019 16:17 EDT   Functional   Sensory Deficits :   None   ADLs Prior to Admission :   Independent   Olen CordialVan Liew, RN, Clarita CraneDonna Alisa - 05/03/2019 16:17 EDT   Social History   Social History   (As Of: 05/03/2019 16:24:42 EDT)   Alcohol:        Wine, 1-2 times per month, 1 Number of Drinks Per Week.  49 hours or more Recent Alcohol Intake.  Previous treatment: None.  Alcohol use interferes with work or home: No.  Drinks more than intended: No.  No Seizures or DT's After Drinking.  Others hurt by drinking: No.  Ready to change: No.  Household alcohol concerns: No.   (Last Updated: 05/03/2019 16:21:05 EDT by Olen CordialVan Liew, RN, Clarita Craneonna Alisa)            Spiritual   Do you have a concern that you would like to address with a Chaplain? :   No   Do you have any religious/spiritual/cultural beliefs that could impact the way your care is provided? :   Yes   Olen CordialVan Liew, RN, Clarita CraneDonna Alisa - 05/03/2019 16:17 EDT   Harm Screen   Feels Unsafe at Home :   Yes   Last 3 mo, thoughts killing self/others :   Patient denies   Olen CordialVan Liew, RN, Clarita CraneDonna Alisa - 05/03/2019 16:17 EDT   Advance Directive   Advance Directive :   No   Olen CordialVan Liew, RN, Clarita CraneDonna Alisa - 05/03/2019 16:17 EDT   Education   Primary Language :   Evette CristalEnglish   Van Liew, RN, Clarita CraneDonna Alisa - 05/03/2019 16:17 EDT   Caregiver/Advocate Language   Patient :   Printed materials, Verbal explanation   Olen CordialVan Liew, RN, Clarita Craneonna Alisa - 05/03/2019 16:17 EDT   Barriers to Learning :   None evident   Olen CordialVan Liew, RN, Clarita Craneonna Alisa - 05/03/2019 16:17 EDT   DC Needs   CM Living Situation :   Family support   Anticipated Discharge Needs :   None   Olen CordialVan Liew, RN, Clarita CraneDonna Alisa - 05/03/2019 16:17 EDT   Valuables and Belongings   Does Patient Have Valuables and Belongings :   Yes   Olen CordialVan  Liew, RN, Clarita CraneDonna Alisa - 05/03/2019 16:17 EDT  Valuables and Belongings   At Bedside :   Clothes, Purse/Wallet, Cell phone   Olen Cordial, RN, Clarita Crane - 05/03/2019 16:17 EDT   Admission Complete   Admission Complete :   Yes   Olen Cordial, RN, Clarita Crane - 05/03/2019 16:17 EDT

## 2019-05-03 NOTE — Progress Notes (Signed)
Progress Note-Nurse               PRBC finished infusing. VS stable, pt denies any complaints.   Signature Line     Electronically Signed on 05/03/2019 08:25 PM EDT   ________________________________________________   Vassie Loll RN, Ernest Haber

## 2019-05-04 LAB — HEMOGLOBIN: Hemoglobin: 7.4 g/dL — ABNORMAL LOW (ref 11.5–15.7)

## 2019-05-04 LAB — C.TRACHOMATIS N.GONORRHOEAE DNA
Chlamydia trachomatis, NAA: POSITIVE — AB
Neisseria Gonorrhoeae, NAA: POSITIVE — AB

## 2019-05-04 LAB — HEMATOCRIT: Hematocrit: 22.2 % — ABNORMAL LOW (ref 34.0–47.0)

## 2019-05-08 LAB — CULTURE, BLOOD 1
